# Patient Record
Sex: Female | Born: 1960 | Race: White | Hispanic: No | Marital: Married | Smoking: Never smoker
Health system: Southern US, Community
[De-identification: ages and names within clinical notes are randomized; demographics above are authoritative.]

## PROBLEM LIST (undated history)

## (undated) DIAGNOSIS — E785 Hyperlipidemia, unspecified: Secondary | ICD-10-CM

## (undated) DIAGNOSIS — Z801 Family history of malignant neoplasm of trachea, bronchus and lung: Secondary | ICD-10-CM

## (undated) DIAGNOSIS — F32A Depression, unspecified: Secondary | ICD-10-CM

## (undated) DIAGNOSIS — Z8041 Family history of malignant neoplasm of ovary: Secondary | ICD-10-CM

## (undated) DIAGNOSIS — R87619 Unspecified abnormal cytological findings in specimens from cervix uteri: Secondary | ICD-10-CM

## (undated) DIAGNOSIS — Z803 Family history of malignant neoplasm of breast: Secondary | ICD-10-CM

## (undated) DIAGNOSIS — Z800 Family history of malignant neoplasm of digestive organs: Secondary | ICD-10-CM

## (undated) DIAGNOSIS — F329 Major depressive disorder, single episode, unspecified: Secondary | ICD-10-CM

## (undated) DIAGNOSIS — F1020 Alcohol dependence, uncomplicated: Secondary | ICD-10-CM

## (undated) DIAGNOSIS — F419 Anxiety disorder, unspecified: Secondary | ICD-10-CM

## (undated) HISTORY — DX: Family history of malignant neoplasm of trachea, bronchus and lung: Z80.1

## (undated) HISTORY — DX: Alcohol dependence, uncomplicated: F10.20

## (undated) HISTORY — DX: Hyperlipidemia, unspecified: E78.5

## (undated) HISTORY — DX: Unspecified abnormal cytological findings in specimens from cervix uteri: R87.619

## (undated) HISTORY — DX: Family history of malignant neoplasm of breast: Z80.3

## (undated) HISTORY — DX: Depression, unspecified: F32.A

## (undated) HISTORY — DX: Family history of malignant neoplasm of ovary: Z80.41

## (undated) HISTORY — DX: Anxiety disorder, unspecified: F41.9

## (undated) HISTORY — DX: Family history of malignant neoplasm of digestive organs: Z80.0

---
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
## 2003-07-22 LAB — HM HIV SCREENING LAB: HM HIV Screening: NEGATIVE

## 2009-12-05 HISTORY — PX: NOSE SURGERY: SHX723

## 2013-12-05 HISTORY — PX: OTHER SURGICAL HISTORY: SHX169

## 2015-09-22 LAB — HEMOGLOBIN A1C: Hemoglobin A1C: 5.7

## 2016-09-15 LAB — TSH: TSH: 2.89 (ref <=5.90)

## 2016-09-15 LAB — VITAMIN D 25 HYDROXY (VIT D DEFICIENCY, FRACTURES): Vit D, 25-Hydroxy: 55.9

## 2016-09-15 LAB — HEMOGLOBIN A1C: Hemoglobin A1C: 5.4

## 2017-12-05 HISTORY — PX: SKIN BIOPSY: SHX1

## 2018-02-02 HISTORY — PX: BREAST BIOPSY: SHX20

## 2018-08-17 LAB — HM MAMMOGRAPHY

## 2019-08-23 LAB — CBC AND DIFFERENTIAL
HCT: 41 (ref 36–46)
Hemoglobin: 13.2 (ref 12.0–16.0)
Neutrophils Absolute: 3
Platelets: 375 (ref 150–399)
WBC: 6.4

## 2019-08-23 LAB — HEPATIC FUNCTION PANEL
ALT: 18 (ref 7–35)
AST: 15 (ref 13–35)
Alkaline Phosphatase: 100 (ref 25–125)
Bilirubin, Total: 0.3

## 2019-08-23 LAB — BASIC METABOLIC PANEL
BUN: 15 (ref 4–21)
CO2: 28 — AB (ref 13–22)
Chloride: 103 (ref 99–108)
Creatinine: 0.8 (ref 0.5–1.1)
Glucose: 95
Potassium: 4.1 (ref 3.4–5.3)
Sodium: 146 (ref 137–147)

## 2019-08-23 LAB — LIPID PANEL
Cholesterol: 214 — AB (ref 0–200)
HDL: 89 — AB (ref 35–70)
LDL Cholesterol: 104
LDl/HDL Ratio: 1.2
Triglycerides: 122 (ref 40–160)

## 2019-08-23 LAB — CBC: RBC: 4.25 (ref 3.87–5.11)

## 2019-08-23 LAB — COMPREHENSIVE METABOLIC PANEL
Albumin: 6.2 — AB (ref 3.5–5.0)
Calcium: 10.4 (ref 8.7–10.7)
GFR calc non Af Amer: 77
Globulin: 2.2

## 2019-08-26 LAB — HM MAMMOGRAPHY

## 2019-09-02 LAB — HM PAP SMEAR

## 2019-10-24 ENCOUNTER — Encounter: Payer: Self-pay | Admitting: Family Medicine

## 2019-10-25 LAB — HM COLONOSCOPY

## 2020-03-18 ENCOUNTER — Other Ambulatory Visit: Payer: Self-pay | Admitting: Family Medicine

## 2020-03-18 ENCOUNTER — Ambulatory Visit (INDEPENDENT_AMBULATORY_CARE_PROVIDER_SITE_OTHER): Payer: 59 | Admitting: Family Medicine

## 2020-03-18 ENCOUNTER — Encounter: Payer: Self-pay | Admitting: Family Medicine

## 2020-03-18 ENCOUNTER — Other Ambulatory Visit: Payer: Self-pay

## 2020-03-18 VITALS — BP 136/75 | HR 81 | Temp 97.8°F | Resp 16 | Ht 66.0 in | Wt 133.6 lb

## 2020-03-18 DIAGNOSIS — M159 Polyosteoarthritis, unspecified: Secondary | ICD-10-CM

## 2020-03-18 DIAGNOSIS — M2560 Stiffness of unspecified joint, not elsewhere classified: Secondary | ICD-10-CM | POA: Diagnosis not present

## 2020-03-18 DIAGNOSIS — L853 Xerosis cutis: Secondary | ICD-10-CM | POA: Diagnosis not present

## 2020-03-18 DIAGNOSIS — R293 Abnormal posture: Secondary | ICD-10-CM | POA: Diagnosis not present

## 2020-03-18 DIAGNOSIS — M8949 Other hypertrophic osteoarthropathy, multiple sites: Secondary | ICD-10-CM

## 2020-03-18 DIAGNOSIS — Z0000 Encounter for general adult medical examination without abnormal findings: Secondary | ICD-10-CM

## 2020-03-18 DIAGNOSIS — Z7689 Persons encountering health services in other specified circumstances: Secondary | ICD-10-CM | POA: Diagnosis not present

## 2020-03-18 DIAGNOSIS — Z1159 Encounter for screening for other viral diseases: Secondary | ICD-10-CM

## 2020-03-18 MED ORDER — TRIAMCINOLONE ACETONIDE 0.5 % EX CREA: 1.0000 "application " | TOPICAL_CREAM | Freq: Two times a day (BID) | CUTANEOUS | 1 refills | Status: DC

## 2020-03-18 NOTE — Progress Notes (Addendum)
Subjective:    Patient ID: Gabrielle Silva, female    DOB: 22-Jun-1961, 59 y.o.   MRN: [id number]  Gabrielle Silva is a 59 y.o. female presenting on 03/18/2020 for [place] (dry skin, patchy rough ever since patient moved from [place])  [place] from [place], [place] to [place]. Recently moved back in past month. Her husband has also recently established here at our office  HPI  Poor Posture / Stiff Joints in Spine / History of Arthritis Reports history of known osteoarthritis, however has not had any recent x-rays or other diagnosis. She says mostly self diagnosis and has history of some popping and cracking and stiffness in neck and back. 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 - Now more active and walking  Dry skin on neck Reports new problem since in [place] for past few weeks. Did not have this problem in [place] but she had similar dry skin under arms in past years ago. - Now she is getting more hot / sweaty, using heavy duty mineral sun screen, sweat irritating her skin and can burn at times. She describes patch of dry flaky skin on back of neck, no where else on body, she uses the sun screen other sun exposed areas but it does not bother her - She is gently patting dry not scrubbing. Not used any other topical except a moisturizer of some kind.  Post-menopausal Previously was on OCP, came off in 2014, she was having worsening symptoms of menopausal. She had variety of symptoms. Started on HRT, did well for a while. Now off for past 3 years, doing well. 1 miscarriage / 2 normal pregnancies  History of Gout (Big toes) / Osteoarthritis - multiple joints, thumbs, neck, spine.  History of Depression / Anxiety - Reports that she is currently doing well. Not on medicines. -Background history Age 56-30s, she had issues  with anxiety depression, related to issue of emotional trauma growing up. One primary stressor was her father. - Later she was in problematic relationship and then self treated with alcohol and then was on xanax for period of about 2 years, then on sertraline, she did rehab 2016, came off this and doing well now. - History of suicide age 30. She thought it would save her to run away to college. - No longer on any substances.  Health Maintenance:  Will request outside health records.  Cervical CA Screening: 3-4 rounds of abnormal pap smears. 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 Not always same. Mild abnormal low grade. Has self resolved. Several years ago for abnormal - Last pap smear 08/2019 - normal Updated 03/19/20 received records. Abstracted last pap smear 09/02/19 NILM, negative High Risk HPV.  Colon CA Screening: Last Colonoscopy 10/2019 (done GI in [place]), results with x 2 polyps benign uncertain what type, good for 5 years. Currently asymptomatic. Known family history of colon CA maternal grandfather age >13-70. Due for screening test in approx 5 years or 2025, will request copy of report Updated 03/19/20 received records. Abstracted last colonoscopy 10/25/19, 2 tubular adenoma, negative for high grade, return 5 years.  Breast CA Screening: Last mammogram result 08/2019 in [place], abnormal with microcalifications, stable benign, had prior biopsy L sided (02/2018). Family history Mother initial breast cancer age 40, then recurrence. Currently asymptomatic -  Updated record on 03/19/20 - received record. 08/2018 dx mammogram birads 2 negative, then next screening mammogram 08/2019 birads 2 negative. abstracted   No flowsheet data found.  Past Medical History:  Diagnosis Date  . Abnormal Pap smear of cervix   . Alcoholism ([place])   . Anxiety   . Depression   . Hyperlipidemia    Past Surgical History:  Procedure Laterality Date  . BREAST BIOPSY Left 02/2018    Left side - microcalcifications  . NOSE SURGERY  2011   deviated septum correct, sinus cavity opening  . skin biopsy  2015   negative biopsy  . SKIN BIOPSY  2019   negative biopsy   Social History   Socioeconomic History  . Marital status: Married    Spouse name: Tarynn Contrera  . Number of children: Not on file  . Years of education: College  . Highest education level: Bachelor's degree (e.g., BA, AB, BS)  Occupational History  . Not on file  Tobacco Use  . Smoking status: Never Smoker  . Smokeless tobacco: Never Used  Substance and Sexual Activity  . Alcohol use: Not Currently  . Drug use: Not Currently    Types: Marijuana  . Sexual activity: Not on file  Other Topics Concern  . Not on file  Social History Narrative  . Not on file   Social Determinants of Health   Financial Resource Strain:   . Difficulty of Paying Living Expenses:   Food Insecurity:   . Worried About Charity fundraiser in the Last Year:   . Arboriculturist in the Last Year:   Transportation Needs:   . Film/video editor (Medical):   Marland Kitchen Lack of Transportation (Non-Medical):   Physical Activity:   . Days of Exercise per Week:   . Minutes of Exercise per Session:   Stress:   . Feeling of Stress :   Social Connections:   . Frequency of Communication with Friends and Family:   . Frequency of Social Gatherings with Friends and Family:   . Attends Religious Services:   . Active Member of Clubs or Organizations:   . Attends Archivist Meetings:   Marland Kitchen Marital Status:   Intimate Partner Violence:   . Fear of Current or Ex-Partner:   . Emotionally Abused:   Marland Kitchen Physically Abused:   . Sexually Abused:    Family History  Problem Relation Age of Onset  . Breast cancer Mother 53       recurrence 54, then age 55+ had metastatic bone, lung, brain  . Osteoarthritis Mother   . Osteoporosis Mother   . Anemia Mother   . Hypertension Mother   . Prostate cancer Father 84  . Mood Disorder  Father   . Dementia Father   . Transient ischemic attack Father   . Hernia Brother   . Colon cancer Maternal Grandfather 33       approximate age  . Skin cancer Maternal Grandfather        multiple, nose, face, head - unsure type  . Seizures Daughter   . Migraines Daughter    Current Outpatient Medications on File Prior to Visit  Medication Sig  . ACETAMINOPHEN 8 HOUR PO Take by mouth.  . calcium carbonate (TUMS - DOSED IN MG ELEMENTAL CALCIUM) 500 MG chewable tablet Chew 1 tablet by mouth daily.  . fluticasone (FLONASE) 50 MCG/ACT nasal spray Place into both nostrils daily.  . Glucosamine-Chondroit-Vit C-Mn (GLUCOSAMINE 1500 COMPLEX PO) Take  by mouth.  . Magnesium Gluconate (MAGNESIUM 27 PO) Take by mouth.  . Multiple Vitamin (MULTIVITAMIN) capsule Take 1 capsule by mouth daily.   No current facility-administered medications on file prior to visit.    Review of Systems Per HPI unless specifically indicated above      Objective:    BP 136/75   Pulse 81   Temp 97.8 F (36.6 C) (Temporal)   Resp 16   Ht 5\' 6"  (1.676 m)   Wt 133 lb 9.6 oz (60.6 kg)   BMI 21.56 kg/m   Wt Readings from Last 3 Encounters:  03/18/20 133 lb 9.6 oz (60.6 kg)    Physical Exam Vitals and nursing note reviewed.  Constitutional:      General: She is not in acute distress.    Appearance: She is well-developed. She is not diaphoretic.     Comments: Well-appearing, thin, comfortable, cooperative  HENT:     Head: Normocephalic and atraumatic.  Eyes:     General:        Right eye: No discharge.        Left eye: No discharge.     Conjunctiva/sclera: Conjunctivae normal.  Neck:     Thyroid: No thyromegaly.  Cardiovascular:     Rate and Rhythm: Normal rate and regular rhythm.     Heart sounds: Normal heart sounds. No murmur.  Pulmonary:     Effort: Pulmonary effort is normal. No respiratory distress.     Breath sounds: Normal breath sounds. No wheezing or rales.  Musculoskeletal:         General: Normal range of motion.     Cervical back: Normal range of motion and neck supple.     Comments: Spine - appears normal alignment, forward bending without asymmetry, no focal palpable spinal tenderness or paraspinal muscle hypertrophy or spasm. - Some audible and palpable pop/crack on flex/ext movement of thoracic spine. Full active range of motion flex/ext /rotate  Resting posture has slight thoracic kyphosis  Lymphadenopathy:     Cervical: No cervical adenopathy.  Skin:    General: Skin is warm and dry.     Findings: Rash (dry flaky patch of skin extensive on back of neck wrapping around to sides of neck, no erythema, no focal lesions) present. No erythema.  Neurological:     Mental Status: She is alert and oriented to person, place, and time.  Psychiatric:        Behavior: Behavior normal.     Comments: Well groomed, good eye contact, normal speech and thoughts      Abstracted prior labs from provided record.  Results for orders placed or performed in visit on 03/18/20  HM HIV SCREENING LAB  Result Value Ref Range   HM HIV Screening Negative - Validated   CBC and differential  Result Value Ref Range   Hemoglobin 13.2 12.0 - 16.0   HCT 41 36 - 46   Neutrophils Absolute 3    Platelets 375 150 - 399   WBC 6.4   CBC  Result Value Ref Range   RBC 4.25 3.87 - 5.11  VITAMIN D 25 Hydroxy (Vit-D Deficiency, Fractures)  Result Value Ref Range   Vit D, 25-Hydroxy [id number]   Basic metabolic panel  Result Value Ref Range   Glucose 95    BUN 15 4 - 21   CO2 28 (A) 13 - 22   Creatinine 0.8 0.5 - 1.1   Potassium 4.1 3.4 - 5.3   Sodium 146 137 -  147   Chloride 103 99 - 108  Comprehensive metabolic panel  Result Value Ref Range   Globulin 2.2    GFR calc non Af Amer 77    Calcium 10.4 8.7 - 10.7   Albumin 6.2 (A) 3.5 - 5.0  Lipid panel  Result Value Ref Range   LDl/HDL Ratio 1.2    Triglycerides 122 40 - 160   Cholesterol 214 (A) 0 - 200   HDL 89 (A) 35 - 70   LDL  Cholesterol 104   Hepatic function panel  Result Value Ref Range   Alkaline Phosphatase 100 25 - 125   ALT 18 7 - 35   AST 15 13 - 35   Bilirubin, Total 0.3   Hemoglobin A1c  Result Value Ref Range   Hemoglobin A1C 5.4   TSH  Result Value Ref Range   TSH 2.89 0.41 - 5.90  Hemoglobin A1c  Result Value Ref Range   Hemoglobin A1C 5.7       Assessment & Plan:   Problem List Items Addressed This Visit    Primary osteoarthritis involving multiple joints   Relevant Medications   ACETAMINOPHEN 8 HOUR PO   Joint stiffness of spine   Dry skin dermatitis - Primary   Relevant Medications   triamcinolone cream (KENALOG) 0.5 %    Other Visit Diagnoses    Encounter to establish care with new doctor       Poor posture          Review provided documents from patient on prior lab results and past history / fam history. Updated chart. Rest to be scanned into chart.  Request records from [place], prior PCP including Mammogram, Colonoscopy, Pap Smear records.  #Arthritis / Posture Clinically with some slight thoracic kyphosis, and some stiffness evident in back with exam and with some audible popping of spine on movement. No obvious deformity or scoliosis - Will review records from previous specialist - Anticipate would offer X-rays C / T spine for further evaluation and diagnosis - Recommend routine exercise, stretching, postural exercise regimen, avoid prolong sedentary fixed position especially seated - Future consider physical therapy to work on core strengthening and other muscle groups to assist in proper posture  #Dry skin dermatitis, neck Significant localized patch of dry flaking skin, appears irritated it is high sun exposure, she is utilizing mineral sunscreen, and other products but seems to be causing excessive dryness of skin in this area, other areas not dry. Does not seem to be allergic response. - Recommendations per AVS - Use new rx Triamcinolone topical steroid 0.5% cream  BID 1-2 weeks then stop - Use OTC Aveeno daily moisturizer after steroid, then may apply sensitive skin sunscreen - change type , limit direct sun exposure - Future follow-up consider Dermatology referral if unresolved  Meds ordered this encounter  Medications  . triamcinolone cream (KENALOG) 0.5 %    Sig: Apply 1 application topically 2 (two) times daily. To affected areas, for up to 2 weeks.    Dispense:  30 g    Refill:  1    Follow up plan: Return in about 5 months (around 08/18/2020) for Annual Physical.   Future labs ordered for 08/12/20   Nobie Putnam, [facility] 03/18/2020, 3:42 PM

## 2020-03-18 NOTE — Patient Instructions (Addendum)
Thank you for coming to the office today.  Recommend to sign up for mychart.  Start with some generic postural stretches / range of motion, neck and upper back  Let me know how it goes, what works and doesn't work and I can try to find additional recommendations for you.  If we need to do future X-rays we can consider this to learn more about possible arthritis.  Also can refer to a Physical Therapist for strengthening if need in the future.  Consider different sun screen product - Previously using mineral sunscreen - try switching to different type of sunscreen with different active ingredients, prefer sensitive type, such as Neutrogena / Aveeno / Cetaphil.  --------------------------------------------------  The rash looks most consistent with eczema, this can flare up and get worse due to a variety of factors (excessive dry skin from bathing/showering, soaps, cold weather / indoor heaters, outdoor exposures).  Use the topical steroid creams twice a day for up to 1 week, maximum duration of use per one flare is 10 to 14 days, then STOP using it and allow skin to recover. Caution with over-use may cause lightening of the skin.   Triamcinolone / Kenalog on body only.  Tips to reduce Flares: For baths/showers, limit bathing to every other day if you can (max 1 x daily)  Use a gentle, unscented soap and lukewarm water (hot water is most irritating to skin) Never scrub skin with too much pressure, this causes more irritation. Pat skin dry, then leave it slightly damp. DO NOT scrub it dry. Apply steroid cream to skin and rub in all the way, wait 15 min, then apply a daily moisturizer (Vaseline, Eucerin, Aveeno). Continue daily moisturizer every day of the year (even after flare is resolved) - If you have eczema on hands or dry hands, recommend wearing any type of gloves overnight (cloth, fabric, or even nitrile/latex) to improve effect of topical moisturizer  Last layer is the  sunscreen.  If develops redness, honey colored crust oozing, drainage of pus, bleeding, or redness / swelling, pain, please return for re-evaluation, may have become infected after scratching.  DUE for FASTING BLOOD WORK (no food or drink after midnight before the lab appointment, only water or coffee without cream/sugar on the morning of)  SCHEDULE "Lab Only" visit in the morning at the clinic for lab draw in 5 MONTHS   - Make sure Lab Only appointment is at about 1 week before your next appointment, so that results will be available  For Lab Results, once available within 2-3 days of blood draw, you can can log in to MyChart online to view your results and a brief explanation. Also, we can discuss results at next follow-up visit.   Please schedule a Follow-up Appointment to: Return in about 5 months (around 08/18/2020) for Annual Physical.  If you have any other questions or concerns, please feel free to call the office or send a message through [facility]. You may also schedule an earlier appointment if necessary.  Additionally, you may be receiving a survey about your experience at our office within a few days to 1 week by e-mail or mail. We value your feedback.  Nobie Putnam, DO [place]

## 2020-07-17 DIAGNOSIS — Z1159 Encounter for screening for other viral diseases: Secondary | ICD-10-CM

## 2020-07-17 DIAGNOSIS — Z0000 Encounter for general adult medical examination without abnormal findings: Secondary | ICD-10-CM

## 2020-08-07 LAB — CBC WITH DIFFERENTIAL/PLATELET
Basophils Absolute: 0.1 10*3/uL (ref 0.0–0.2)
Basos: 1 %
EOS (ABSOLUTE): 0.1 10*3/uL (ref 0.0–0.4)
Eos: 2 %
Hematocrit: 40.9 % (ref 34.0–46.6)
Hemoglobin: 13.5 g/dL (ref 11.1–15.9)
Immature Grans (Abs): 0 10*3/uL (ref 0.0–0.1)
Immature Granulocytes: 0 %
Lymphocytes Absolute: 2.8 10*3/uL (ref 0.7–3.1)
Lymphs: 44 %
MCH: 32.1 pg (ref 26.6–33.0)
MCHC: 33 g/dL (ref 31.5–35.7)
MCV: 97 fL (ref 79–97)
Monocytes Absolute: 0.6 10*3/uL (ref 0.1–0.9)
Monocytes: 9 %
Neutrophils Absolute: 2.9 10*3/uL (ref 1.4–7.0)
Neutrophils: 44 %
Platelets: 317 10*3/uL (ref 150–450)
RBC: 4.2 x10E6/uL (ref 3.77–5.28)
RDW: 12.9 % (ref 11.7–15.4)
WBC: 6.5 10*3/uL (ref 3.4–10.8)

## 2020-08-07 LAB — COMPREHENSIVE METABOLIC PANEL
ALT: 26 IU/L (ref 0–32)
AST: 22 IU/L (ref 0–40)
Albumin/Globulin Ratio: 2.1 (ref 1.2–2.2)
Albumin: 5.1 g/dL — ABNORMAL HIGH (ref 3.8–4.9)
Alkaline Phosphatase: 100 IU/L (ref 48–121)
BUN/Creatinine Ratio: 17 (ref 9–23)
BUN: 14 mg/dL (ref 6–24)
Bilirubin Total: 0.4 mg/dL (ref 0.0–1.2)
CO2: 24 mmol/L (ref 20–29)
Calcium: 9.8 mg/dL (ref 8.7–10.2)
Chloride: 102 mmol/L (ref 96–106)
Creatinine, Ser: 0.83 mg/dL (ref 0.57–1.00)
GFR calc Af Amer: 90 mL/min/{1.73_m2} (ref >59)
GFR calc non Af Amer: 78 mL/min/{1.73_m2} (ref >59)
Globulin, Total: 2.4 g/dL (ref 1.5–4.5)
Glucose: 81 mg/dL (ref 65–99)
Potassium: 4.5 mmol/L (ref 3.5–5.2)
Sodium: 140 mmol/L (ref 134–144)
Total Protein: 7.5 g/dL (ref 6.0–8.5)

## 2020-08-07 LAB — LIPID PANEL
Chol/HDL Ratio: 2.4 ratio (ref 0.0–4.4)
Cholesterol, Total: 219 mg/dL — ABNORMAL HIGH (ref 100–199)
HDL: 92 mg/dL (ref >39)
LDL Chol Calc (NIH): 108 mg/dL — ABNORMAL HIGH (ref 0–99)
Triglycerides: 109 mg/dL (ref 0–149)
VLDL Cholesterol Cal: 19 mg/dL (ref 5–40)

## 2020-08-07 LAB — HEMOGLOBIN A1C
Est. average glucose Bld gHb Est-mCnc: 111 mg/dL
Hgb A1c MFr Bld: 5.5 % (ref 4.8–5.6)

## 2020-08-07 LAB — TSH: TSH: 2.84 u[IU]/mL (ref 0.450–4.500)

## 2020-08-07 LAB — HEPATITIS C ANTIBODY: Hep C Virus Ab: <0.1 s/co ratio (ref 0.0–0.9)

## 2020-08-12 ENCOUNTER — Other Ambulatory Visit: Payer: 59

## 2020-08-18 ENCOUNTER — Ambulatory Visit (INDEPENDENT_AMBULATORY_CARE_PROVIDER_SITE_OTHER): Payer: 59 | Admitting: Family Medicine

## 2020-08-18 ENCOUNTER — Encounter: Payer: Self-pay | Admitting: Family Medicine

## 2020-08-18 ENCOUNTER — Other Ambulatory Visit: Payer: Self-pay

## 2020-08-18 VITALS — BP 103/56 | HR 75 | Temp 97.3°F | Resp 16 | Ht 66.0 in | Wt 127.0 lb

## 2020-08-18 DIAGNOSIS — Z23 Encounter for immunization: Secondary | ICD-10-CM

## 2020-08-18 DIAGNOSIS — Z808 Family history of malignant neoplasm of other organs or systems: Secondary | ICD-10-CM

## 2020-08-18 DIAGNOSIS — Z0000 Encounter for general adult medical examination without abnormal findings: Secondary | ICD-10-CM | POA: Diagnosis not present

## 2020-08-18 DIAGNOSIS — Z1283 Encounter for screening for malignant neoplasm of skin: Secondary | ICD-10-CM

## 2020-08-18 DIAGNOSIS — Z1231 Encounter for screening mammogram for malignant neoplasm of breast: Secondary | ICD-10-CM | POA: Diagnosis not present

## 2020-08-18 DIAGNOSIS — E7800 Pure hypercholesterolemia, unspecified: Secondary | ICD-10-CM

## 2020-08-18 NOTE — Patient Instructions (Addendum)
Thank you for coming to the office today.  Tdap shot today, 10 years.  Not concerned on mild elevated albumin protein.  Cholesterol, still very good HDL (good cholesterol) and only minimal elevated LDL bad cholesterol, goal is to keep improving this one with lifestyle.  The 10-year ASCVD risk score Mikey Bussing DC Jr., et al., 2013) is: 1.3%   Values used to calculate the score:     Age: 59 years     Sex: Female     Is Non-Hispanic African American: No     Diabetic: No     Tobacco smoker: No     Systolic Blood Pressure: 277 mmHg     Is BP treated: No     HDL Cholesterol: 92 mg/dL     Total Cholesterol: 219 mg/dL   In future if risk goes up, we can discuss treatment  -----------------------------   For Mammogram screening for breast cancer   Call the [facility] below anytime to schedule your own appointment now that order has been placed.  Order sent to [facility] [address] [place], [place] [address] Phone: [phone]   ------------------- [facility] [place], [place] [address] Phone: [phone]   -------------------------------------------------------------  [facility]  [email or web address]  Check out their site, you can contact them for cost / billing if you are interested  We can see you for a visit to order this and discuss results in future as needed.   Recommend total body surface exam - order is placed.  [place]   [place], [place] [address] Hours: 8AM-5PM Phone: [phone]  Sarina Ser, MD Brendolyn Patty, MD  ----------------------------------  For bladder  Limit caffeine  And bladder irritants  Also recommend timed voiding, set your own schedule to retrain your bladder muscles Can do pelvic floor exercises as well for strengthening And double voiding strategy.  DUE for FASTING BLOOD WORK (no food or drink  after midnight before the lab appointment, only water or coffee without cream/sugar on the morning of)  SCHEDULE "Lab Only" visit in the morning at the clinic for lab draw in 1 YEAR  - Make sure Lab Only appointment is at about 1 week before your next appointment, so that results will be available  For Lab Results, once available within 2-3 days of blood draw, you can can log in to MyChart online to view your results and a brief explanation. Also, we can discuss results at next follow-up visit.    Please schedule a Follow-up Appointment to: Return in about 1 year (around 08/18/2021) for Annual Physical.  If you have any other questions or concerns, please feel free to call the office or send a message through [facility]. You may also schedule an earlier appointment if necessary.  Additionally, you may be receiving a survey about your experience at our office within a few days to 1 week by e-mail or mail. We value your feedback.  Nobie Putnam, DO [place]

## 2020-08-18 NOTE — Progress Notes (Signed)
Subjective:    Patient ID: Gabrielle Silva, female    DOB: 1961-05-18, 59 y.o.   MRN: [id number]  Gabrielle Silva is a 59 y.o. female presenting on 08/18/2020 for Annual Exam   HPI   Here for Annual Physical and Lab Review.  HYPERLIPIDEMIA, elevated LDL - Reports concerns with lipids. Last lipid panel 08/2020, mostly controlled LDL at 108 mild elevated, HDL at 92, excellent result - Not on statin Lifestyle - Diet: balanced healthy diet - Exercise: regular walking, outdoor activity  Post-menopausal Previously was on OCP, came off in 2014, she was having worsening symptoms of menopausal. She had variety of symptoms. Started on HRT, did well for a while. Now off for past 3 years, doing well. 1 miscarriage / 2 normal pregnancies  History of Gout (Big toes) / Osteoarthritis - multiple joints, thumbs, neck, spine.  History of Depression / Anxiety - Reports that she is currently doing well. Not on medicines. -Background history Age 58-30s, she had issues with anxiety depression, related to issue of emotional trauma growing up. One primary stressor was her father. - Later she was in problematic relationship and then self treated with alcohol and then was on xanax for period of about 2 years, then on sertraline, she did rehab 2016, came off this and doing well now. - History of suicide age 53. She thought it would save her to run away to college. - No longer on any substances.  Additional question  Genetic Screening Cancer Interested in testing in future for genetic risk of cancers.  Skin Cancer Screening Interested in Dermatologist evaluation, she has high amount of sun exposure, fam history of skin cancer, unsure if melanoma.  Health Maintenance:  Declines Flu vaccine  TDap today, last 9-10 years ago.  Cervical CA Screening: 3-4 rounds of abnormal pap smears. 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 Not always same. Mild abnormal low grade. Has self resolved.  Several years ago for abnormal - Last pap smear 08/2019 - normal Updated 03/19/20 received records. Abstracted last pap smear 09/02/19 NILM, negative High Risk HPV.  Colon CA Screening: Last Colonoscopy 10/2019 (done GI in [place]), results with x 2 polyps benign uncertain what type, good for 5 years. Currently asymptomatic. Known family history of colon CA maternal grandfather age >70-70. Due for screening test in approx 5 years or 2025, will request copy of report Updated 03/19/20 received records. Abstracted last colonoscopy 10/25/19, 2 tubular adenoma, negative for high grade, return 5 years.  Breast CA Screening: Last mammogram result 08/2019 in [place], abnormal with microcalifications, stable benign, had prior biopsy L sided (02/2018). Family history Mother initial breast cancer age 41, then recurrence. Currently asymptomatic - Updated record on 03/19/20 - received record. 08/2018 dx mammogram birads 2 negative, then next screening mammogram 08/2019 birads 2 negative. Abstracted - She would like to go to [place] for next Mammogram this year, 08/2020, she will call to schedule.     Depression screen [facility] 2/9 08/18/2020  Decreased Interest 0  Down, Depressed, Hopeless 0  PHQ - 2 Score 0   No flowsheet data found.    Past Medical History:  Diagnosis Date  . Abnormal Pap smear of cervix   . Alcoholism ([place])   . Anxiety   . Depression   . Hyperlipidemia    Past Surgical History:  Procedure Laterality Date  . BREAST BIOPSY Left 02/2018   Left side - microcalcifications  . NOSE SURGERY  2011   deviated septum correct,  sinus cavity opening  . skin biopsy  2015   negative biopsy  . SKIN BIOPSY  2019   negative biopsy   Social History   Socioeconomic History  . Marital status: Married    Spouse name: Collin Hendley  . Number of children: Not on file  . Years of education: College  . Highest education level: Bachelor's degree (e.g., BA, AB, BS)  Occupational History   . Not on file  Tobacco Use  . Smoking status: Never Smoker  . Smokeless tobacco: Never Used  Substance and Sexual Activity  . Alcohol use: Not Currently  . Drug use: Not Currently    Types: Marijuana  . Sexual activity: Not on file  Other Topics Concern  . Not on file  Social History Narrative  . Not on file   Social Determinants of Health   Financial Resource Strain:   . Difficulty of Paying Living Expenses: Not on file  Food Insecurity:   . Worried About Charity fundraiser in the Last Year: Not on file  . Ran Out of Food in the Last Year: Not on file  Transportation Needs:   . Lack of Transportation (Medical): Not on file  . Lack of Transportation (Non-Medical): Not on file  Physical Activity:   . Days of Exercise per Week: Not on file  . Minutes of Exercise per Session: Not on file  Stress:   . Feeling of Stress : Not on file  Social Connections:   . Frequency of Communication with Friends and Family: Not on file  . Frequency of Social Gatherings with Friends and Family: Not on file  . Attends Religious Services: Not on file  . Active Member of Clubs or Organizations: Not on file  . Attends Archivist Meetings: Not on file  . Marital Status: Not on file  Intimate Partner Violence:   . Fear of Current or Ex-Partner: Not on file  . Emotionally Abused: Not on file  . Physically Abused: Not on file  . Sexually Abused: Not on file   Family History  Problem Relation Age of Onset  . Breast cancer Mother 69       recurrence 43, then age 35+ had metastatic bone, lung, brain  . Osteoarthritis Mother   . Osteoporosis Mother   . Anemia Mother   . Hypertension Mother   . Heart disease Mother        stent  . Prostate cancer Father 22  . Mood Disorder Father   . Dementia Father   . Transient ischemic attack Father   . Hernia Brother   . Colon cancer Maternal Grandfather 9       approximate age  . Skin cancer Maternal Grandfather        multiple, nose,  face, head - unsure type  . Seizures Daughter   . Migraines Daughter    Current Outpatient Medications on File Prior to Visit  Medication Sig  . ACETAMINOPHEN 8 HOUR PO Take by mouth.  . calcium carbonate (TUMS - DOSED IN MG ELEMENTAL CALCIUM) 500 MG chewable tablet Chew 1 tablet by mouth daily.  . fluticasone (FLONASE) 50 MCG/ACT nasal spray Place into both nostrils daily.  . Glucosamine-Chondroit-Vit C-Mn (GLUCOSAMINE 1500 COMPLEX PO) Take by mouth.  . Magnesium Gluconate (MAGNESIUM 27 PO) Take by mouth.  . Multiple Vitamin (MULTIVITAMIN) capsule Take 1 capsule by mouth daily.  Marland Kitchen triamcinolone cream (KENALOG) 0.5 % Apply 1 application topically 2 (two) times daily. To affected areas, for  up to 2 weeks.   No current facility-administered medications on file prior to visit.    Review of Systems  Constitutional: Negative for activity change, appetite change, chills, diaphoresis, fatigue and fever.  HENT: Negative for congestion and hearing loss.   Eyes: Negative for visual disturbance.  Respiratory: Negative for apnea, cough, chest tightness, shortness of breath and wheezing.   Cardiovascular: Negative for chest pain, palpitations and leg swelling.  Gastrointestinal: Negative for abdominal pain, anal bleeding, blood in stool, constipation, diarrhea, nausea and vomiting.  Endocrine: Negative for cold intolerance.  Genitourinary: Positive for frequency. Negative for difficulty urinating, dysuria and hematuria.  Musculoskeletal: Negative for arthralgias, back pain and neck pain.  Skin: Negative for rash.  Allergic/Immunologic: Negative for environmental allergies.  Neurological: Negative for dizziness, weakness, light-headedness, numbness and headaches.  Hematological: Negative for adenopathy.  Psychiatric/Behavioral: Negative for behavioral problems, dysphoric mood and sleep disturbance. The patient is not nervous/anxious.    Per HPI unless specifically indicated above     Objective:     BP (!) 103/56   Pulse 75   Temp (!) 97.3 F (36.3 C) (Temporal)   Resp 16   Ht 5\' 6"  (1.676 m)   Wt 127 lb (57.6 kg)   SpO2 99%   BMI 20.50 kg/m   Wt Readings from Last 3 Encounters:  08/18/20 127 lb (57.6 kg)  03/18/20 133 lb 9.6 oz (60.6 kg)    Physical Exam Vitals and nursing note reviewed.  Constitutional:      General: She is not in acute distress.    Appearance: She is well-developed. She is not diaphoretic.     Comments: Well-appearing, comfortable, cooperative  HENT:     Head: Normocephalic and atraumatic.     Right Ear: Tympanic membrane, ear canal and external ear normal. There is no impacted cerumen.     Left Ear: Tympanic membrane, ear canal and external ear normal. There is no impacted cerumen.  Eyes:     General:        Right eye: No discharge.        Left eye: No discharge.     Conjunctiva/sclera: Conjunctivae normal.     Pupils: Pupils are equal, round, and reactive to light.  Neck:     Thyroid: No thyromegaly.     Vascular: No carotid bruit.  Cardiovascular:     Rate and Rhythm: Normal rate and regular rhythm.     Heart sounds: Normal heart sounds. No murmur heard.   Pulmonary:     Effort: Pulmonary effort is normal. No respiratory distress.     Breath sounds: Normal breath sounds. No wheezing or rales.  Abdominal:     General: Bowel sounds are normal. There is no distension.     Palpations: Abdomen is soft. There is no mass.     Tenderness: There is no abdominal tenderness.  Musculoskeletal:        General: No tenderness. Normal range of motion.     Cervical back: Normal range of motion and neck supple.     Comments: Upper / Lower Extremities: - Normal muscle tone, strength bilateral upper extremities 5/5, lower extremities 5/5  Lymphadenopathy:     Cervical: No cervical adenopathy.  Skin:    General: Skin is warm and dry.     Findings: No erythema or rash.  Neurological:     Mental Status: She is alert and oriented to person, place, and  time.     Comments: Distal sensation intact to light touch  all extremities  Psychiatric:        Behavior: Behavior normal.     Comments: Well groomed, good eye contact, normal speech and thoughts    Results for orders placed or performed in visit on 07/17/20  Hepatitis C antibody  Result Value Ref Range   Hep C Virus Ab <0.1 0.0 - 0.9 s/co ratio  TSH  Result Value Ref Range   TSH 2.840 0.450 - 4.500 uIU/mL  Comprehensive metabolic panel  Result Value Ref Range   Glucose 81 65 - 99 mg/dL   BUN 14 6 - 24 mg/dL   Creatinine, Ser 0.83 0.57 - 1.00 mg/dL   GFR calc non Af Amer 78 >59 mL/min/1.73   GFR calc Af Amer 90 >59 mL/min/1.73   BUN/Creatinine Ratio 17 9 - 23   Sodium 140 134 - 144 mmol/L   Potassium 4.5 3.5 - 5.2 mmol/L   Chloride 102 96 - 106 mmol/L   CO2 24 20 - 29 mmol/L   Calcium 9.8 8.7 - 10.2 mg/dL   Total Protein 7.5 6.0 - 8.5 g/dL   Albumin 5.1 (H) 3.8 - 4.9 g/dL   Globulin, Total 2.4 1.5 - 4.5 g/dL   Albumin/Globulin Ratio 2.1 1.2 - 2.2   Bilirubin Total 0.4 0.0 - 1.2 mg/dL   Alkaline Phosphatase 100 48 - 121 IU/L   AST 22 0 - 40 IU/L   ALT 26 0 - 32 IU/L  Lipid panel  Result Value Ref Range   Cholesterol, Total 219 (H) 100 - 199 mg/dL   Triglycerides 109 0 - 149 mg/dL   HDL 92 >39 mg/dL   VLDL Cholesterol Cal 19 5 - 40 mg/dL   LDL Chol Calc (NIH) 108 (H) 0 - 99 mg/dL   Chol/HDL Ratio 2.4 0.0 - 4.4 ratio  CBC with Differential/Platelet  Result Value Ref Range   WBC 6.5 3.4 - 10.8 x10E3/uL   RBC 4.20 3.77 - 5.28 x10E6/uL   Hemoglobin 13.5 11.1 - 15.9 g/dL   Hematocrit 40.9 34.0 - 46.6 %   MCV 97 79 - 97 fL   MCH 32.1 26.6 - 33.0 pg   MCHC 33.0 31 - 35 g/dL   RDW 12.9 11.7 - 15.4 %   Platelets 317 150 - 450 x10E3/uL   Neutrophils 44 Not Estab. %   Lymphs 44 Not Estab. %   Monocytes 9 Not Estab. %   Eos 2 Not Estab. %   Basos 1 Not Estab. %   Neutrophils Absolute 2.9 1 - 7 x10E3/uL   Lymphocytes Absolute 2.8 0 - 3 x10E3/uL   Monocytes Absolute 0.6 0  - 0 x10E3/uL   EOS (ABSOLUTE) 0.1 0.0 - 0.4 x10E3/uL   Basophils Absolute 0.1 0 - 0 x10E3/uL   Immature Granulocytes 0 Not Estab. %   Immature Grans (Abs) 0.0 0.0 - 0.1 x10E3/uL  Hemoglobin A1c  Result Value Ref Range   Hgb A1c MFr Bld 5.5 4.8 - 5.6 %   Est. average glucose Bld gHb Est-mCnc 111 mg/dL      Assessment & Plan:   Problem List Items Addressed This Visit    Pure hypercholesterolemia    Mostly Controlled cholesterol on lifestyle Not on statin Last lipid panel 08/2020 The 10-year ASCVD risk score Mikey Bussing DC Jr., et al., 2013) is: 1.3%  Plan: 1. Discussed her lipids in detail - HDL is protective and reassuring. She has very limited ASCVD risk factors, 10 yr score is low, not indicated for statin therapy at  age 84, future reconsider, reviewed calculation 2. Encourage improved lifestyle - low carb/cholesterol, reduce portion size, continue improving regular exercise       Other Visit Diagnoses    Annual physical exam    -  Primary   Need for diphtheria-tetanus-pertussis (Tdap) vaccine       Relevant Orders   Tdap vaccine greater than or equal to 7yo IM (Completed)   Encounter for screening mammogram for malignant neoplasm of breast       Relevant Orders   MM 3D SCREEN BREAST BILATERAL   Family history of skin cancer       Relevant Orders   Ambulatory referral to Dermatology   Skin cancer screening       Relevant Orders   Ambulatory referral to Dermatology      Updated Health Maintenance information - TDap today, good for 10 years - Ordered Mammogram screening at [facility], patient to schedule Reviewed recent lab results with patient Encouraged improvement to lifestyle with diet and exercise Goal maintain healthy weight  #Skin CA Screening Referral to [place], screening total body unsure melanoma, grandfather skin cancer  Orders Placed This Encounter  Procedures  . MM 3D SCREEN BREAST BILATERAL    Standing Status:    Future    Standing Expiration Date:   08/18/2021    Order Specific Question:   Reason for Exam (SYMPTOM  OR DIAGNOSIS REQUIRED)    Answer:   Screening bilateral 3D Mammogram [place]    Order Specific Question:   Preferred imaging location?    Answer:   [facility]  . Tdap vaccine greater than or equal to 7yo IM  . Ambulatory referral to Dermatology    Referral Priority:   Routine    Referral Type:   Consultation    Referral Reason:   Specialty Services Required    Requested Specialty:   Dermatology    Number of Visits Requested:   1     No orders of the defined types were placed in this encounter.     Follow up plan: Return in about 1 year (around 08/18/2021) for Annual Physical.  Future labs 08/2021  Nobie Putnam, [facility] 08/18/2020, 9:33 AM

## 2020-08-19 ENCOUNTER — Other Ambulatory Visit: Payer: Self-pay | Admitting: Family Medicine

## 2020-08-19 DIAGNOSIS — Z0000 Encounter for general adult medical examination without abnormal findings: Secondary | ICD-10-CM

## 2020-08-19 DIAGNOSIS — E7800 Pure hypercholesterolemia, unspecified: Secondary | ICD-10-CM

## 2020-08-19 NOTE — Assessment & Plan Note (Signed)
Mostly Controlled cholesterol on lifestyle Not on statin Last lipid panel 08/2020 The 10-year ASCVD risk score Mikey Bussing DC Jr., et al., 2013) is: 1.3%  Plan: 1. Discussed her lipids in detail - HDL is protective and reassuring. She has very limited ASCVD risk factors, 10 yr score is low, not indicated for statin therapy at age 58, future reconsider, reviewed calculation 2. Encourage improved lifestyle - low carb/cholesterol, reduce portion size, continue improving regular exercise

## 2020-08-27 ENCOUNTER — Other Ambulatory Visit: Payer: Self-pay

## 2020-08-27 ENCOUNTER — Ambulatory Visit
Admission: RE | Admit: 2020-08-27 | Discharge: 2020-08-27 | Disposition: A | Discharge status: Home / Self Care | Payer: 59 | Source: Ambulatory Visit | Attending: Family Medicine | Admitting: Family Medicine

## 2020-08-27 DIAGNOSIS — Z1231 Encounter for screening mammogram for malignant neoplasm of breast: Secondary | ICD-10-CM | POA: Diagnosis not present

## 2020-12-23 ENCOUNTER — Encounter: Payer: Self-pay | Admitting: Dermatology

## 2020-12-23 ENCOUNTER — Ambulatory Visit (INDEPENDENT_AMBULATORY_CARE_PROVIDER_SITE_OTHER): Payer: 59 | Admitting: Dermatology

## 2020-12-23 ENCOUNTER — Other Ambulatory Visit: Payer: Self-pay

## 2020-12-23 DIAGNOSIS — L7211 Pilar cyst: Secondary | ICD-10-CM | POA: Diagnosis not present

## 2020-12-23 DIAGNOSIS — L578 Other skin changes due to chronic exposure to nonionizing radiation: Secondary | ICD-10-CM

## 2020-12-23 DIAGNOSIS — L814 Other melanin hyperpigmentation: Secondary | ICD-10-CM

## 2020-12-23 DIAGNOSIS — L570 Actinic keratosis: Secondary | ICD-10-CM | POA: Diagnosis not present

## 2020-12-23 DIAGNOSIS — Q825 Congenital non-neoplastic nevus: Secondary | ICD-10-CM | POA: Diagnosis not present

## 2020-12-23 DIAGNOSIS — D229 Melanocytic nevi, unspecified: Secondary | ICD-10-CM

## 2020-12-23 DIAGNOSIS — D1800 Hemangioma unspecified site: Secondary | ICD-10-CM

## 2020-12-23 DIAGNOSIS — Z86018 Personal history of other benign neoplasm: Secondary | ICD-10-CM

## 2020-12-23 DIAGNOSIS — D2371 Other benign neoplasm of skin of right lower limb, including hip: Secondary | ICD-10-CM

## 2020-12-23 DIAGNOSIS — Z1283 Encounter for screening for malignant neoplasm of skin: Secondary | ICD-10-CM

## 2020-12-23 DIAGNOSIS — L821 Other seborrheic keratosis: Secondary | ICD-10-CM

## 2020-12-23 NOTE — Progress Notes (Signed)
New Patient Visit  Subjective  Gabrielle Silva is a 60 y.o. female who presents for the following: tbse (New patient here today for tbse. Patient denies personal history of skin cancer but does run in patient's family. Patient states she has history of areas removed in past. ). She notes a spot at her left scalp and a spot on her left cheek.  Patient here for full body skin exam and skin cancer screening.   Objective  Well appearing patient in no apparent distress; mood and affect are within normal limits.  A full examination was performed including scalp, head, eyes, ears, nose, lips, neck, chest, axillae, abdomen, back, buttocks, bilateral upper extremities, bilateral lower extremities, hands, feet, fingers, toes, fingernails, and toenails. All findings within normal limits unless otherwise noted below.  Objective  left frontal scalp: Subcutaneous papule at scalp.   Objective  left superior cheek: Erythematous thin papules with gritty scale.   Objective  Left Plantar Surface of Heel: Violaceous patch  Assessment & Plan  Pilar cyst left frontal scalp  Benign-appearing.  Observation.  Call clinic for new or changing lesions.  Recommend daily use of broad spectrum spf 30+ sunscreen to sun-exposed areas.    Actinic keratosis left superior cheek  Discussed LN2 vs topical therapy with prescription 5-Fluorouracil-calcipotriene cream. Pt prefers topical therapy.   As a result of treatment, redness, scaling, crusting, and open sores may occur during treatment course. One or more than one of these methods may be used and may have to be used several times to control, suppress and eliminate the PreCancerous changes. Discussed treatment course, expected reaction, and possible side effects.   Start 5-fluorouracil/calcipotriene cream twice a day for 4 days to affected areas. Prescription sent to [facility]. Patient advised they will receive an email to purchase  the medication online and have it sent to their home. Patient provided with handout reviewing treatment course and side effects and advised to call or message [place] on MyChart with any concerns.   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 Benign appearing.   Lentigines - Scattered tan macules - Discussed due to sun exposure - Benign, observe - Call for any changes  Seborrheic Keratoses - Stuck-on, waxy, tan-brown papules and plaques  - Discussed benign etiology and prognosis. - Observe - Call for any changes  Melanocytic Nevi - Tan-brown and/or pink-flesh-colored symmetric macules and papules - Benign appearing on exam today - Observation - Call clinic for new or changing moles - Recommend daily use of broad spectrum spf 30+ sunscreen to sun-exposed areas.   Hemangiomas - Red papules - Discussed benign nature - Observe - Call for any changes  Dermatofibroma Right lateral knee  - Firm pink/brown papulenodule with dimple sign - Benign appearing - Call for any changes  Capillary Stain  Left heel  Actinic Damage - Chronic, secondary to cumulative UV/sun exposure - diffuse scaly erythematous macules with underlying dyspigmentation - Recommend daily broad spectrum sunscreen SPF 30+ to sun-exposed areas, reapply every 2 hours as needed.  - Call for new or changing lesions.  History of Dysplastic Nevi Left upper arm  - No evidence of recurrence today - Recommend regular full body skin exams - Recommend daily broad spectrum sunscreen SPF 30+ to sun-exposed areas, reapply every 2 hours as needed.  - Call if any new or changing lesions are noted between office visits - Will request records   Skin cancer screening performed today.  Return in about 3  months (around 03/23/2021), or need record request form filled out, for recheck ak on face .   I, Ruthell Rummage, CMA, am acting as scribe for Forest Gleason, MD.  Documentation: I have reviewed  the above documentation for accuracy and completeness, and I agree with the above.  Forest Gleason, MD

## 2020-12-23 NOTE — Patient Instructions (Addendum)
Melanoma ABCDEs  Melanoma is the most dangerous type of skin cancer, and is the leading cause of death from skin disease.  You are more likely to develop melanoma if you:  Have light-colored skin, light-colored eyes, or red or blond hair  Spend a lot of time in the sun  Tan regularly, either outdoors or in a tanning bed  Have had blistering sunburns, especially during childhood  Have a close family member who has had a melanoma  Have atypical moles or large birthmarks  Early detection of melanoma is key since treatment is typically straightforward and cure rates are extremely high if we catch it early.   The first sign of melanoma is often a change in a mole or a new dark spot.  The ABCDE system is a way of remembering the signs of melanoma.  A for asymmetry:  The two halves do not match. B for border:  The edges of the growth are irregular. C for color:  A mixture of colors are present instead of an even brown color. D for diameter:  Melanomas are usually (but not always) greater than 55mm - the size of a pencil eraser. E for evolution:  The spot keeps changing in size, shape, and color.  Please check your skin once per month between visits. You can use a small mirror in front and a large mirror behind you to keep an eye on the back side or your body.   If you see any new or changing lesions before your next follow-up, please call to schedule a visit.  Please continue daily skin protection including broad spectrum sunscreen SPF 30+ to sun-exposed areas, reapplying every 2 hours as needed when you're outdoors.   Recommend taking Heliocare sun protection supplement daily in sunny weather for additional sun protection. For maximum protection on the sunniest days, you can take up to 2 capsules of regular Heliocare OR take 1 capsule of Heliocare Ultra. For prolonged exposure (such as a full day in the sun), you can repeat your dose of the supplement 4 hours after your first dose. Heliocare  can be purchased at [facility] or at [email or web address].   Instructions for [facility] Medications  One or more of your medications was sent to the [facility] mail order compounding pharmacy. You will receive an email from them and can purchase the medicine through that link. It will then be mailed to your home at the address you confirmed. If for any reason you do not receive an email from them, please check your spam folder. If you still do not find the email, please let us know. [facility] phone number is [phone].  5-Fluorouracil/Calcipotriene Patient Education   Actinic keratoses are the dry, red scaly spots on the skin caused by sun damage. A portion of these spots can turn into skin cancer with time, and treating them can help prevent development of skin cancer.   Treatment of these spots requires removal of the defective skin cells. There are various ways to remove actinic keratoses, including freezing with liquid nitrogen, treatment with creams, or treatment with a blue light procedure in the office.   5-fluorouracil cream is a topical cream used to treat actinic keratoses. It works by interfering with the growth of abnormal fast-growing skin cells, such as actinic keratoses. These cells peel off and are replaced by healthy ones.   5-fluorouracil/calcipotriene is a combination of the 5-fluorouracil cream with a vitamin D analog cream called calcipotriene. The calcipotriene alone does  not treat actinic keratoses. However, when it is combined with 5-fluorouracil, it helps the 5-fluorouracil treat the actinic keratoses much faster so that the same results can be achieved with a much shorter treatment time.  INSTRUCTIONS FOR 5-FLUOROURACIL/CALCIPOTRIENE CREAM:   5-fluorouracil/calcipotriene cream typically only needs to be used for 4-7 days. A thin layer should be applied twice a day to the treatment areas recommended by your physician.   If your physician  prescribed you separate tubes of 5-fluourouracil and calcipotriene, apply a thin layer of 5-fluorouracil followed by a thin layer of calcipotriene.   Avoid contact with your eyes, nostrils, and mouth. Do not use 5-fluorouracil/calcipotriene cream on infected or open wounds.   You will develop redness, irritation and some crusting at areas where you have pre-cancer damage/actinic keratoses. IF YOU DEVELOP PAIN, BLEEDING, OR SIGNIFICANT CRUSTING, STOP THE TREATMENT EARLY - you have already gotten a good response and the actinic keratoses should clear up well.  Wash your hands after applying 5-fluorouracil 5% cream on your skin.   A moisturizer or sunscreen with a minimum SPF 30 should be applied each morning.   Once you have finished the treatment, you can apply a thin layer of Vaseline twice a day to irritated areas to soothe and calm the areas more quickly. If you experience significant discomfort, contact your physician.  For some patients it is necessary to repeat the treatment for best results.  SIDE EFFECTS: When using 5-fluorouracil/calcipotriene cream, you may have mild irritation, such as redness, dryness, swelling, or a mild burning sensation. This usually resolves within 2 weeks. The more actinic keratoses you have, the more redness and inflammation you can expect during treatment. Eye irritation has been reported rarely. If this occurs, please let us know.  If you have any trouble using this cream, please call the office. If you have any other questions about this information, please do not hesitate to ask me before you leave the office.  Apply twice a day for 4 days.

## 2021-01-07 ENCOUNTER — Encounter: Payer: Self-pay | Admitting: Dermatology

## 2021-03-24 ENCOUNTER — Ambulatory Visit (INDEPENDENT_AMBULATORY_CARE_PROVIDER_SITE_OTHER): Payer: 59 | Admitting: Dermatology

## 2021-03-24 ENCOUNTER — Other Ambulatory Visit: Payer: Self-pay

## 2021-03-24 DIAGNOSIS — L821 Other seborrheic keratosis: Secondary | ICD-10-CM | POA: Diagnosis not present

## 2021-03-24 DIAGNOSIS — L578 Other skin changes due to chronic exposure to nonionizing radiation: Secondary | ICD-10-CM

## 2021-03-24 DIAGNOSIS — Z872 Personal history of diseases of the skin and subcutaneous tissue: Secondary | ICD-10-CM | POA: Diagnosis not present

## 2021-03-24 NOTE — Patient Instructions (Addendum)
If you have any questions or concerns for your doctor, please call our main line at [phone] and press option 4 to reach your doctor's medical assistant. If no one answers, please leave a voicemail as directed and we will return your call as soon as possible. Messages left after 4 pm will be answered the following business day.   You may also send [place] a message via [place]. We typically respond to MyChart messages within 1-2 business days.  For prescription refills, please ask your pharmacy to contact our office. Our fax number is [phone].  If you have an urgent issue when the clinic is closed that cannot wait until the next business day, you can page your doctor at the number below.    Please note that while we do our best to be available for urgent issues outside of office hours, we are not available 24/7.   If you have an urgent issue and are unable to reach [place], you may choose to seek medical care at your doctor's office, retail clinic, urgent care center, or emergency room.  If you have a medical emergency, please immediately call 911 or go to the emergency department.  Pager Numbers  - Dr. Nehemiah Massed: [phone]  - Dr. Laurence Ferrari: [phone]  - Dr. Nicole Kindred: [phone]  In the event of inclement weather, please call our main line at [phone] for an update on the status of any delays or closures.  Dermatology Medication Tips: Please keep the boxes that topical medications come in in order to help keep track of the instructions about where and how to use these. Pharmacies typically print the medication instructions only on the boxes and not directly on the medication tubes.   If your medication is too expensive, please contact our office at [phone] option 4 or send [place] a message through [place].   We are unable to tell what your co-pay for medications will be in advance as this is different depending on your insurance coverage. However, we may be able to find a substitute  medication at lower cost or fill out paperwork to get insurance to cover a needed medication.   If a prior authorization is required to get your medication covered by your insurance company, please allow [place] 1-2 business days to complete this process.  Drug prices often vary depending on where the prescription is filled and some pharmacies may offer cheaper prices.  The website www.goodrx.com contains coupons for medications through different pharmacies. The prices here do not account for what the cost may be with help from insurance (it may be cheaper with your insurance), but the website can give you the price if you did not use any insurance.  - You can print the associated coupon and take it with your prescription to the pharmacy.  - You may also stop by our office during regular business hours and pick up a GoodRx coupon card.  - If you need your prescription sent electronically to a different pharmacy, notify our office through [facility] or by phone at [phone] option 4.  Recommend daily broad spectrum sunscreen SPF 30+ to sun-exposed areas, reapply every 2 hours as needed. Call for new or changing lesions.  Staying in the shade or wearing long sleeves, sun glasses (UVA+UVB protection) and wide brim hats (4-inch brim around the entire circumference of the hat) are also recommended for sun protection.      Some Recommended Sunscreens Include:  Body or All Over Sunscreen Blue lizard sensitive Sun bum mineral (  avoid if sensitive to scent) Aveeno Positively Mineral Neutrogena sheer zinc (Slightly harder to rub in) CVS clear zinc (Slightly harder to rub in)  Clear Face Sunscreen EltaMD UV Elements CeraVe hydrating mineral sunscreen 50 face  Tinted Face Sunscreen Alastin Hydratint (good for most skin tones, may be slightly dark if you are very fair) Colorescience Sunforgettable Total Protection Face Shield (good for most skin tones) EltaMD UV Physical La Roche Posay  Mineral Tinted Cotz Flawless Complexion   Powder Sunscreen (Nice for reapplying or applying on the go) Colorescience Sunforgettable Total Protection Brush on Shield (available in different tints)  Face Sunscreen Available in Different Tints Colorescience Sunforgettable Total Protection Brush on Shield  bareMinerals Complexion Rescue Tinted Hydrating Gel Cream Broad Spectrum SPF 30 UnSun mineral tinted (comes in medium/dark and light/medium)  Kids (over 6 months) - Mineral Sunscreens Recommended eltaMD UV Pure MDsolarSciences KidStick 40 SPF Aveeno Baby Continuous Protection Sensitive Zinc Oxide Blue Lizard Kids mineral based sunscreen lotion Mustela Mineral Sunscreen for face and body Neutrogena Sheer Zinc Kids Sunscreen Stick  Tinted to look like a tan PCAskin sheer tint body spray   Recommend taking Heliocare sun protection supplement daily in sunny weather for additional sun protection. For maximum protection on the sunniest days, you can take up to 2 capsules of regular Heliocare OR take 1 capsule of Heliocare Ultra. For prolonged exposure (such as a full day in the sun), you can repeat your dose of the supplement 4 hours after your first dose. Heliocare can be purchased at [facility] or at [email or web address].    [email or web address]

## 2021-03-24 NOTE — Progress Notes (Signed)
   Follow-Up Visit   Subjective  Gabrielle Silva is a 60 y.o. female who presents for the following: Follow-up (Patient here today for AK follow up at left superior cheek. Patient used 5FU/calcipotriene to treat for 7 days total with a 2-3 day break in the middle. ).  The following portions of the chart were reviewed this encounter and updated as appropriate:   Tobacco  Allergies  Meds  Problems  Med Hx  Surg Hx  Fam Hx      Review of Systems:  No other skin or systemic complaints except as noted in HPI or Assessment and Plan.  Objective  Well appearing patient in no apparent distress; mood and affect are within normal limits.  A focused examination was performed including face. Relevant physical exam findings are noted in the Assessment and Plan.   Assessment & Plan    History of PreCancerous Actinic Keratosis  - site(s) of PreCancerous Actinic Keratosis clear today. - these may recur and new lesions may form requiring treatment to prevent transformation into skin cancer - observe for new or changing spots and contact [place] for appointment if occur - photoprotection with sun protective clothing; sunglasses and broad spectrum sunscreen with SPF of at least 30 + and frequent self skin exams recommended - yearly exams by a dermatologist recommended for persons with history of PreCancerous Actinic Keratoses  Seborrheic Keratoses - Stuck-on, waxy, tan-brown papules and/or plaques  - Benign-appearing - Discussed benign etiology and prognosis. - Observe - Call for any changes  Actinic Damage - chronic, secondary to cumulative UV radiation exposure/sun exposure over time - diffuse scaly erythematous macules with underlying dyspigmentation - Recommend daily broad spectrum sunscreen SPF 30+ to sun-exposed areas, reapply every 2 hours as needed.  - Recommend staying in the shade or wearing long sleeves, sun glasses (UVA+UVB protection) and wide brim hats (4-inch brim  around the entire circumference of the hat). - Call for new or changing lesions. - Recommend taking Heliocare sun protection supplement daily in sunny weather for additional sun protection. For maximum protection on the sunniest days, you can take up to 2 capsules of regular Heliocare OR take 1 capsule of Heliocare Ultra. For prolonged exposure (such as a full day in the sun), you can repeat your dose of the supplement 4 hours after your first dose.    Return in about 37 weeks (around 12/08/2021) for TBSE.  Graciella Belton, RMA, am acting as scribe for Forest Gleason, MD .  Documentation: I have reviewed the above documentation for accuracy and completeness, and I agree with the above.  Forest Gleason, MD

## 2021-03-29 ENCOUNTER — Encounter: Payer: Self-pay | Admitting: Dermatology

## 2021-06-13 ENCOUNTER — Emergency Department
Admission: EM | Admit: 2021-06-13 | Discharge: 2021-06-13 | Disposition: A | Discharge status: Home / Self Care | Payer: 59 | Attending: Emergency Medicine | Admitting: Emergency Medicine

## 2021-06-13 ENCOUNTER — Other Ambulatory Visit: Payer: Self-pay

## 2021-06-13 DIAGNOSIS — H43392 Other vitreous opacities, left eye: Secondary | ICD-10-CM | POA: Diagnosis present

## 2021-06-13 MED ORDER — PHENYLEPHRINE HCL 2.5 % OP SOLN
1.0000 [drp] | Freq: Once | OPHTHALMIC | Status: AC
  Administered 2021-06-13: 1 [drp] via OPHTHALMIC
  Filled 2021-06-13: qty 2

## 2021-06-13 MED ORDER — TROPICAMIDE 1 % OP SOLN
1.0000 [drp] | Freq: Once | OPHTHALMIC | Status: AC
  Administered 2021-06-13: 1 [drp] via OPHTHALMIC
  Filled 2021-06-13: qty 15

## 2021-06-13 NOTE — ED Triage Notes (Signed)
Pt states that she thinks she has a retinal tear in her L eye- pt describes having more floaters than usual and an arc of light in her L eye- pt denies pain- pt denies any eye or head injury

## 2021-06-13 NOTE — ED Notes (Signed)
Per Dr. Edison Pace, pt may be discharged. Dr Cinda Quest notified and to bedside.

## 2021-06-13 NOTE — Discharge Instructions (Addendum)
Please return for worsening symptoms.  Please follow-up with your eye doctor as planned.

## 2021-06-13 NOTE — ED Notes (Signed)
Drops requested from pharmacy.

## 2021-06-13 NOTE — ED Provider Notes (Signed)
[facility] Emergency Department Provider Note   ____________________________________________   Event Date/Time   First MD Initiated Contact with Patient 06/13/21 1906     (approximate)  I have reviewed the triage vital signs and the nursing notes.   HISTORY  Chief Complaint Eye Problem  HPI Dorla Guizar is a 60 y.o. female who reports she saw a lot of floaters and an arc of bright light in her left eye.  She has no pain she has no eye trauma.  This started suddenly.  She reports she has severe nearsightedness.  She reports her father had similar symptoms with retinal attachment.  Vision is 20/20 bilaterally.        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 Authorizing Provider  ACETAMINOPHEN 8 HOUR PO Take by mouth.    [provider]  calcium carbonate (TUMS - DOSED IN MG ELEMENTAL CALCIUM) 500 MG chewable tablet Chew 1 tablet by mouth daily.    [provider]  fluticasone (FLONASE) 50 MCG/ACT nasal spray Place into both nostrils daily.    [provider]  Glucosamine-Chondroit-Vit C-Mn (GLUCOSAMINE 1500 COMPLEX PO) Take by mouth.    [provider]  ketotifen (ZADITOR) 0.025 % ophthalmic solution Place 1 drop into both eyes as needed.     [provider]  Magnesium Gluconate (MAGNESIUM 27 PO) Take by mouth.    [provider]  Multiple Vitamin (MULTIVITAMIN) capsule Take 1 capsule by mouth daily.    [provider]    Allergies Patient has no known allergies.  Family History  Problem Relation Age of Onset   Breast cancer Mother 85       recurrence 56, then age 10+ had metastatic bone, lung, brain   Osteoarthritis Mother    Osteoporosis Mother    Anemia Mother    Hypertension Mother    Heart disease Mother        stent   Prostate cancer Father 71   Mood Disorder Father    Dementia Father    Transient ischemic attack Father    Hernia Brother    Colon cancer Maternal Grandfather 79       approximate age   Skin cancer Maternal Grandfather        multiple, nose, face, head - unsure type   Seizures Daughter    Migraines Daughter    Breast cancer Maternal Aunt        mat great aunt    Social History Social History   Tobacco Use   Smoking status: Never   Smokeless tobacco: Never  Substance Use Topics   Alcohol use: Not Currently   Drug use: Not Currently    Types: Marijuana  Constitutional: No fever/chills Eyes: See HPI ENT: No sore throat. Cardiovascular: Denies chest pain. Respiratory: Denies shortness of breath. Gastrointestinal: No abdominal pain.  No nausea, no vomiting.  No diarrhea.  No constipation. Genitourinary: Negative for dysuria. Musculoskeletal: Negative for back pain. Skin: Negative for rash.   ____________________________________________   PHYSICAL EXAM:  VITAL SIGNS: ED Triage Vitals [06/13/21 1852]  Enc Vitals Group     BP (!) 148/79     Pulse Rate 73     Resp 18     Temp 99.1 F (37.3 C)     Temp Source Oral     SpO2 100 %     Weight 117 lb (53.1 kg)     Height 5\' 5"  (1.651 m)     Head Circumference      Peak Flow      Pain Score 0     Pain Loc      Pain Edu?      Excl. in [place]?    Constitutional: Alert and oriented. Well appearing  and in no acute distress. Eyes: Conjunctivae are normal. PERRL. EOMI. difficult to see fundi. Head: Atraumatic. Nose: No congestion/rhinnorhea. Mouth/Throat: Mucous membranes are moist.  Oropharynx non-erythematous. Neck: No stridor.   Cardiovascular: Normal rate, regular rhythm.  Good peripheral circulation. Respiratory: Normal respiratory effort.  No retractions.  Gastrointestinal:  No distention.  Neurologic:  Normal speech and language. No gross focal neurologic deficits are appreciated. No gait instability. Skin:  Skin is warm, dry and intact. No rash noted.   ____________________________________________   LABS (all labs ordered are listed, but only abnormal results are displayed)  Labs Reviewed - No data to display ____________________________________________  EKG   ____________________________________________  RADIOLOGY Gertha Calkin, personally viewed and evaluated these images (plain radiographs) as part of my medical decision making, as well as reviewing the written report by the radiologist.  ED MD interpretati  Official radiology report(s): No results found.  ____________________________________________   PROCEDURES  Procedure(s) performed (including Critical Care):  Procedures   ____________________________________________   INITIAL IMPRESSION / ASSESSMENT AND PLAN / ED COURSE  Visual acuity is 20/20 bilaterally.  Discussed patient with Dr. Edison Pace ophthalmology who asked me to put in 1 drop a of [place] mild and 1 drop of phenylephrine each eye.  He comes in and evaluates patient he thinks this is due to              ____________________________________________   FINAL CLINICAL IMPRESSION(S) / ED DIAGNOSES  Final diagnoses:  Vitreous floaters of left eye     ED Discharge Orders     None        Note:  This document was prepared using Dragon voice recognition software and may include unintentional dictation errors.     Nena Polio, MD 06/13/21 2120

## 2021-06-13 NOTE — ED Notes (Signed)
Pt escorted to eye exam room by ophthalmologist. .

## 2021-06-13 NOTE — Consult Note (Signed)
Reason for Consult: rule out retinal detachment Referring Physician: Dr. Cinda Quest, [facility] ED Chief complaint: new floaters and flashes, left eye.  HPI: Gabrielle Silva is an 60 y.o. 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  She first noticed this at 2 pm today.  When she was moving her eyes back and forth she noticed a C-shaped flash temporally in the left eye.  She denies eye pain, vision loss, blurry vision, curtain like vision loss, headache.  Past Medical History:  Diagnosis Date   Abnormal Pap smear of cervix    Alcoholism ([place])    Anxiety    Depression    Hyperlipidemia     ROS  Past Surgical History:  Procedure Laterality Date   BREAST BIOPSY Left 02/2018   Left side - microcalcifications neg   NOSE SURGERY  2011   deviated septum correct, sinus cavity opening   skin biopsy  2015   negative biopsy   SKIN BIOPSY  2019   negative biopsy    Family History  Problem Relation Age of Onset   Breast cancer Mother 77       recurrence 67, then age 60+ had metastatic bone, lung, brain   Osteoarthritis Mother    Osteoporosis Mother    Anemia Mother    Hypertension Mother    Heart disease Mother        stent   Prostate cancer Father 65   Mood Disorder Father    Dementia Father    Transient ischemic attack Father    Hernia Brother    Colon cancer Maternal Grandfather 16       approximate age   Skin cancer Maternal Grandfather        multiple, nose, face, head - unsure type   Seizures Daughter    Migraines Daughter    Breast cancer Maternal Aunt        mat great aunt    Social History:  reports that she has never smoked. She has never used smokeless tobacco. She reports previous alcohol use. She reports previous drug use. Drug: Marijuana.  Allergies: No Known Allergies  Prior to Admission medications   Medication Sig Start Date End Date Taking? Authorizing Provider  ACETAMINOPHEN 8 HOUR PO Take  by mouth.    [provider]  calcium carbonate (TUMS - DOSED IN MG ELEMENTAL CALCIUM) 500 MG chewable tablet Chew 1 tablet by mouth daily.    [provider]  fluticasone (FLONASE) 50 MCG/ACT nasal spray Place into both nostrils daily.    [provider]  Glucosamine-Chondroit-Vit C-Mn (GLUCOSAMINE 1500 COMPLEX PO) Take by mouth.    [provider]  ketotifen (ZADITOR) 0.025 % ophthalmic solution Place 1 drop into both eyes as needed.    [provider]  Magnesium Gluconate (MAGNESIUM 27 PO) Take by mouth.    [provider]  Multiple Vitamin (MULTIVITAMIN) capsule Take 1 capsule by mouth daily.    [provider]    No results found for this or any previous visit (from the past 48 hour(s)).  No results found.  Blood pressure (!) 148/79, pulse 73, temperature 99.1 F (37.3 C), temperature source Oral, resp. rate 18, height 5\' 5"  (1.651 m), weight 53.1 kg, SpO2 100 %.  Mental status: Alert and Oriented x 4  Visual Acuity:  20/25 OD  20/25 near cc  Pupils:  Equally round/ reactive to light.  No Afferent defect.  Motility:  Full/ orthophoric  Visual Fields:  Full to confrontation  IOP:  soft to palpation.  External/ Lids/ Lashes:  Normal  Anterior Segment:  Conjunctiva:  Normal  OU  Cornea:  Normal  OU  Anterior Chamber: Normal  OU  Lens:   Normal OU  Posterior Segment: Dilated OU with 1% Tropicamide and 2.5% Phenylephrine  Discs:   Normal c/d ratio, no pallor, no edema OU 0.15 OU.  Macula:  Normal  Vessels/ Periphery: Normal.  Small area of paving stone degeneration at 5:00 left eye.  No retinal tears or holes, scleral depression 360 OS>.  PVD OS.    Assessment/Plan: Posterior vitreous degeneration, left eye.  + FH of retinal tear (father).  Today there are no retinal tears, no retinal detachment.  RD precautions/symptoms discussed.  Followup outpatient [facility] 4-6 weeks.    [name] 06/13/2021, 8:33 PM

## 2021-07-14 ENCOUNTER — Other Ambulatory Visit: Payer: Self-pay | Admitting: Family Medicine

## 2021-07-14 DIAGNOSIS — Z1231 Encounter for screening mammogram for malignant neoplasm of breast: Secondary | ICD-10-CM

## 2021-08-10 ENCOUNTER — Other Ambulatory Visit: Payer: Self-pay | Admitting: Family Medicine

## 2021-08-10 DIAGNOSIS — E7800 Pure hypercholesterolemia, unspecified: Secondary | ICD-10-CM

## 2021-08-10 DIAGNOSIS — Z0000 Encounter for general adult medical examination without abnormal findings: Secondary | ICD-10-CM

## 2021-08-11 LAB — COMPREHENSIVE METABOLIC PANEL
ALT: 23 IU/L (ref 0–32)
AST: 20 IU/L (ref 0–40)
Albumin/Globulin Ratio: 2.4 — ABNORMAL HIGH (ref 1.2–2.2)
Albumin: 4.8 g/dL (ref 3.8–4.9)
Alkaline Phosphatase: 97 IU/L (ref 44–121)
BUN/Creatinine Ratio: 16 (ref 9–23)
BUN: 12 mg/dL (ref 6–24)
Bilirubin Total: 0.5 mg/dL (ref 0.0–1.2)
CO2: 22 mmol/L (ref 20–29)
Calcium: 10 mg/dL (ref 8.7–10.2)
Chloride: 100 mmol/L (ref 96–106)
Creatinine, Ser: 0.75 mg/dL (ref 0.57–1.00)
Globulin, Total: 2 g/dL (ref 1.5–4.5)
Glucose: 90 mg/dL (ref 65–99)
Potassium: 4.7 mmol/L (ref 3.5–5.2)
Sodium: 140 mmol/L (ref 134–144)
Total Protein: 6.8 g/dL (ref 6.0–8.5)
eGFR: 92 mL/min/{1.73_m2} (ref >59)

## 2021-08-11 LAB — CBC WITH DIFFERENTIAL/PLATELET
Basophils Absolute: 0.1 10*3/uL (ref 0.0–0.2)
Basos: 1 %
EOS (ABSOLUTE): 0.1 10*3/uL (ref 0.0–0.4)
Eos: 2 %
Hematocrit: 40.3 % (ref 34.0–46.6)
Hemoglobin: 13.5 g/dL (ref 11.1–15.9)
Immature Grans (Abs): 0 10*3/uL (ref 0.0–0.1)
Immature Granulocytes: 0 %
Lymphocytes Absolute: 2.6 10*3/uL (ref 0.7–3.1)
Lymphs: 43 %
MCH: 32.6 pg (ref 26.6–33.0)
MCHC: 33.5 g/dL (ref 31.5–35.7)
MCV: 97 fL (ref 79–97)
Monocytes Absolute: 0.5 10*3/uL (ref 0.1–0.9)
Monocytes: 9 %
Neutrophils Absolute: 2.8 10*3/uL (ref 1.4–7.0)
Neutrophils: 45 %
Platelets: 346 10*3/uL (ref 150–450)
RBC: 4.14 x10E6/uL (ref 3.77–5.28)
RDW: 12 % (ref 11.7–15.4)
WBC: 6.1 10*3/uL (ref 3.4–10.8)

## 2021-08-11 LAB — LIPID PANEL
Chol/HDL Ratio: 2.2 ratio (ref 0.0–4.4)
Cholesterol, Total: 226 mg/dL — ABNORMAL HIGH (ref 100–199)
HDL: 102 mg/dL (ref >39)
LDL Chol Calc (NIH): 108 mg/dL — ABNORMAL HIGH (ref 0–99)
Triglycerides: 96 mg/dL (ref 0–149)
VLDL Cholesterol Cal: 16 mg/dL (ref 5–40)

## 2021-08-11 LAB — HEMOGLOBIN A1C
Est. average glucose Bld gHb Est-mCnc: 111 mg/dL
Hgb A1c MFr Bld: 5.5 % (ref 4.8–5.6)

## 2021-08-11 LAB — TSH: TSH: 2.03 u[IU]/mL (ref 0.450–4.500)

## 2021-08-23 ENCOUNTER — Encounter: Payer: Self-pay | Admitting: Family Medicine

## 2021-08-23 ENCOUNTER — Ambulatory Visit (INDEPENDENT_AMBULATORY_CARE_PROVIDER_SITE_OTHER): Payer: 59 | Admitting: Family Medicine

## 2021-08-23 ENCOUNTER — Other Ambulatory Visit: Payer: Self-pay

## 2021-08-23 VITALS — BP 111/59 | HR 71 | Ht 65.0 in | Wt 122.2 lb

## 2021-08-23 DIAGNOSIS — Z23 Encounter for immunization: Secondary | ICD-10-CM

## 2021-08-23 DIAGNOSIS — Z0000 Encounter for general adult medical examination without abnormal findings: Secondary | ICD-10-CM

## 2021-08-23 NOTE — Progress Notes (Signed)
Subjective:    Patient ID: Gabrielle Silva, female    DOB: [birth year], 60 y.o.   MRN: [id number]  Gabrielle Silva is a 60 y.o. female presenting on 08/23/2021 for Annual Exam   HPI  Here for Annual Physical and Lab Review.  Lifestyle / wellness  Lab review and comparison today  HYPERLIPIDEMIA, elevated LDL - Reports concerns with lipids. Last lipid panel 9/022 showed stable lipids with HDL >100 and LDL 108 overall gradual increase LDL  - Not on statin Lifestyle - Diet: balanced healthy diet - Exercise: regular walking, outdoor activity   Post-menopausal Previously was on OCP, came off in 2014, she was having worsening symptoms of menopausal. She had variety of symptoms. Started on HRT, did well for a while. Now off for past 3 years, doing well. 1 miscarriage / 2 normal pregnancies   History of Gout (Big toes) / Osteoarthritis - multiple joints, thumbs, neck, spine.   History of Depression / Anxiety - Reports that she is currently doing well. Not on medicines. Reviewed past history - No longer on any substances.   Additional question  Updates Due for Shingrix vaccine today  Family history - Adenocarcinoma of the Lung, younger sister age 60, ultimately same cancer that her mother passed from.  Left eye vitreol humor detached, resolved. Not a tear.    Skin Cancer Screening Interested in Dermatologist evaluation, she has high amount of sun exposure, fam history of skin cancer, unsure if melanoma.   Health Maintenance:  COVID - completed 1st series, and booster x 2, and has not had newest Omicron variant vaccine.   Declines Flu vaccine  No pneumonia vaccine indicated at this time.   Tdap updated 08/18/20   Cervical CA Screening: 3-4 rounds of abnormal pap smears. 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 Not always same. Mild abnormal low grade. Has self resolved. Several years ago for abnormal Updated 03/19/20 received records. Abstracted last pap  smear 09/02/19 NILM, negative High Risk HPV.   Colon CA Screening: Last Colonoscopy 10/2019 (done GI in [place]), results with x 2 polyps benign uncertain what type, good for 5 years. Currently asymptomatic. Known family history of colon CA maternal grandfather age >40-70. Due for screening test in approx 5 years or 2025 Updated 03/19/20 received records. Abstracted last colonoscopy 10/25/19, 2 tubular adenoma, negative for high grade, return 5 years.   Breast CA Screening: Last mammogram result 08/2020 Previous results in [place], abnormal with microcalifications, stable benign, had prior biopsy L sided (02/2018). Family history Mother initial breast cancer age 59, then recurrence. Currently asymptomatic - Updated record on 03/19/20 - received record. 08/2018 dx mammogram birads 2 negative, then screening mammogram 08/2019 birads 2 negative. Abstracted     Depression screen [facility] 2/9 08/23/2021 08/18/2020  Decreased Interest 0 0  Down, Depressed, Hopeless 0 0  PHQ - 2 Score 0 0  Altered sleeping 0 -  Tired, decreased energy 0 -  Change in appetite 0 -  Feeling bad or failure about yourself  0 -  Trouble concentrating 1 -  Moving slowly or fidgety/restless 0 -  Suicidal thoughts 0 -  PHQ-9 Score 1 -  Difficult doing work/chores Not difficult at all -    Past Medical History:  Diagnosis Date   Abnormal Pap smear of cervix    Alcoholism ([place])    Anxiety    Depression    Hyperlipidemia    Past Surgical History:  Procedure Laterality Date   BREAST BIOPSY Left  02/2018   Left side - microcalcifications neg   NOSE SURGERY  2011   deviated septum correct, sinus cavity opening   skin biopsy  2015   negative biopsy   SKIN BIOPSY  2019   negative biopsy   Social History   Socioeconomic History   Marital status: Married    Spouse name: Marieclaire Bettenhausen   Number of children: Not on file   Years of education: College   Highest education level: Bachelor's degree (e.g., BA, AB, BS)   Occupational History   Not on file  Tobacco Use   Smoking status: Never   Smokeless tobacco: Never  Substance and Sexual Activity   Alcohol use: Not Currently   Drug use: Not Currently    Types: Marijuana   Sexual activity: Not on file  Other Topics Concern   Not on file  Social History Narrative   Not on file   Social Determinants of Health   Financial Resource Strain: Not on file  Food Insecurity: Not on file  Transportation Needs: Not on file  Physical Activity: Not on file  Stress: Not on file  Social Connections: Not on file  Intimate Partner Violence: Not on file   Family History  Problem Relation Age of Onset   Breast cancer Mother 64       recurrence 23, then age 61+ had metastatic bone, lung, brain   Osteoarthritis Mother    Osteoporosis Mother    Anemia Mother    Hypertension Mother    Heart disease Mother        stent   Prostate cancer Father 76   Mood Disorder Father    Dementia Father    Transient ischemic attack Father    Lung cancer Sister    Hernia Brother    Colon cancer Maternal Grandfather 79       approximate age   Skin cancer Maternal Grandfather        multiple, nose, face, head - unsure type   Seizures Daughter    Migraines Daughter    Breast cancer Maternal Aunt        mat great aunt   Current Outpatient Medications on File Prior to Visit  Medication Sig   ACETAMINOPHEN 8 HOUR PO Take by mouth.   calcium carbonate (TUMS - DOSED IN MG ELEMENTAL CALCIUM) 500 MG chewable tablet Chew 1 tablet by mouth daily.   fluticasone (FLONASE) 50 MCG/ACT nasal spray Place into both nostrils daily.   Glucosamine-Chondroit-Vit C-Mn (GLUCOSAMINE 1500 COMPLEX PO) Take by mouth.   ketotifen (ZADITOR) 0.025 % ophthalmic solution Place 1 drop into both eyes as needed.   Magnesium Gluconate (MAGNESIUM 27 PO) Take by mouth.   Multiple Vitamin (MULTIVITAMIN) capsule Take 1 capsule by mouth daily.   No current facility-administered medications on file prior  to visit.    Review of Systems  Constitutional:  Negative for activity change, appetite change, chills, diaphoresis, fatigue and fever.  HENT:  Negative for congestion and hearing loss.   Eyes:  Negative for visual disturbance.  Respiratory:  Negative for cough, chest tightness, shortness of breath and wheezing.   Cardiovascular:  Negative for chest pain, palpitations and leg swelling.  Gastrointestinal:  Negative for abdominal pain, constipation, diarrhea, nausea and vomiting.  Genitourinary:  Negative for dysuria, frequency and hematuria.  Musculoskeletal:  Negative for arthralgias and neck pain.  Skin:  Negative for rash.  Neurological:  Negative for dizziness, weakness, light-headedness, numbness and headaches.  Hematological:  Negative for adenopathy.  Psychiatric/Behavioral:  Negative for behavioral problems, dysphoric mood and sleep disturbance.   Per HPI unless specifically indicated above     Objective:    BP (!) 111/59   Pulse 71   Ht 5' 5"  (1.651 m)   Wt 122 lb 3.2 oz (55.4 kg)   SpO2 100%   BMI 20.34 kg/m   Wt Readings from Last 3 Encounters:  08/23/21 122 lb 3.2 oz (55.4 kg)  06/13/21 117 lb (53.1 kg)  08/18/20 127 lb (57.6 kg)    Physical Exam Vitals and nursing note reviewed.  Constitutional:      General: She is not in acute distress.    Appearance: She is well-developed. She is not diaphoretic.     Comments: Well-appearing, comfortable, cooperative  HENT:     Head: Normocephalic and atraumatic.  Eyes:     General:        Right eye: No discharge.        Left eye: No discharge.     Conjunctiva/sclera: Conjunctivae normal.     Pupils: Pupils are equal, round, and reactive to light.  Neck:     Thyroid: No thyromegaly.  Cardiovascular:     Rate and Rhythm: Normal rate and regular rhythm.     Pulses: Normal pulses.     Heart sounds: Normal heart sounds. No murmur heard. Pulmonary:     Effort: Pulmonary effort is normal. No respiratory distress.      Breath sounds: Normal breath sounds. No wheezing or rales.  Abdominal:     General: Bowel sounds are normal. There is no distension.     Palpations: Abdomen is soft. There is no mass.     Tenderness: There is no abdominal tenderness.  Musculoskeletal:        General: No tenderness. Normal range of motion.     Cervical back: Normal range of motion and neck supple.     Comments: Upper / Lower Extremities: - Normal muscle tone, strength bilateral upper extremities 5/5, lower extremities 5/5  Lymphadenopathy:     Cervical: No cervical adenopathy.  Skin:    General: Skin is warm and dry.     Findings: No erythema or rash.  Neurological:     Mental Status: She is alert and oriented to person, place, and time.     Comments: Distal sensation intact to light touch all extremities  Psychiatric:        Mood and Affect: Mood normal.        Behavior: Behavior normal.        Thought Content: Thought content normal.     Comments: Well groomed, good eye contact, normal speech and thoughts     Results for orders placed or performed in visit on 08/10/21  CBC with Differential/Platelet  Result Value Ref Range   WBC 6.1 3.4 - 10.8 x10E3/uL   RBC 4.14 3.77 - 5.28 x10E6/uL   Hemoglobin 13.5 11.1 - 15.9 g/dL   Hematocrit 40.3 34.0 - 46.6 %   MCV 97 79 - 97 fL   MCH 32.6 26.6 - 33.0 pg   MCHC 33.5 31.5 - 35.7 g/dL   RDW 12.0 11.7 - 15.4 %   Platelets 346 150 - 450 x10E3/uL   Neutrophils 45 Not Estab. %   Lymphs 43 Not Estab. %   Monocytes 9 Not Estab. %   Eos 2 Not Estab. %   Basos 1 Not Estab. %   Neutrophils Absolute 2.8 1.4 - 7.0 x10E3/uL   Lymphocytes Absolute 2.6  0.7 - 3.1 x10E3/uL   Monocytes Absolute 0.5 0.1 - 0.9 x10E3/uL   EOS (ABSOLUTE) 0.1 0.0 - 0.4 x10E3/uL   Basophils Absolute 0.1 0.0 - 0.2 x10E3/uL   Immature Granulocytes 0 Not Estab. %   Immature Grans (Abs) 0.0 0.0 - 0.1 x10E3/uL  Lipid panel  Result Value Ref Range   Cholesterol, Total 226 (H) 100 - 199 mg/dL    Triglycerides 96 0 - 149 mg/dL   HDL 102 >39 mg/dL   VLDL Cholesterol Cal 16 5 - 40 mg/dL   LDL Chol Calc (NIH) 108 (H) 0 - 99 mg/dL   Chol/HDL Ratio 2.2 0.0 - 4.4 ratio  Hemoglobin A1c  Result Value Ref Range   Hgb A1c MFr Bld 5.5 4.8 - 5.6 %   Est. average glucose Bld gHb Est-mCnc 111 mg/dL  TSH  Result Value Ref Range   TSH 2.030 0.450 - 4.500 uIU/mL  Comprehensive metabolic panel  Result Value Ref Range   Glucose 90 65 - 99 mg/dL   BUN 12 6 - 24 mg/dL   Creatinine, Ser 0.75 0.57 - 1.00 mg/dL   eGFR 92 >59 mL/min/1.73   BUN/Creatinine Ratio 16 9 - 23   Sodium 140 134 - 144 mmol/L   Potassium 4.7 3.5 - 5.2 mmol/L   Chloride 100 96 - 106 mmol/L   CO2 22 20 - 29 mmol/L   Calcium 10.0 8.7 - 10.2 mg/dL   Total Protein 6.8 6.0 - 8.5 g/dL   Albumin 4.8 3.8 - 4.9 g/dL   Globulin, Total 2.0 1.5 - 4.5 g/dL   Albumin/Globulin Ratio 2.4 (H) 1.2 - 2.2   Bilirubin Total 0.5 0.0 - 1.2 mg/dL   Alkaline Phosphatase 97 44 - 121 IU/L   AST 20 0 - 40 IU/L   ALT 23 0 - 32 IU/L      Assessment & Plan:   Problem List Items Addressed This Visit   None Visit Diagnoses     Annual physical exam    -  Primary   Need for shingles vaccine       Relevant Orders   Varicella-zoster vaccine IM       Updated Health Maintenance information Shingrix vaccine dose #1 today OUT of stock - will return for 1st dose then  repeat 2nd dose 2-6 months Reviewed recent lab results with patient Encouraged improvement to lifestyle with diet and exercise Goal of weight loss  New fam history dx adenocarcinoma of lung awaiting updates for genetic risk or other risk factors, may pursue further testing based on these updates or can consider Chest X-ray preliminary testing in future   No orders of the defined types were placed in this encounter.   Follow up plan: Return in about 1 year (around 08/23/2022) for 1 year fasting lab only then 1 week later Annual Physical.   Nobie Putnam, DO [facility] 08/23/2021, 9:39 AM

## 2021-08-23 NOTE — Patient Instructions (Addendum)
Thank you for coming to the office today.  Due for Shingrix shingles vaccine when ready, we can contact you when its back in stock, or feel free to check back message or call within 1 week.  Next dose in 2-6 months after you receive 1st shingrix.  Then when ready, can do new COVID Omicron Bivalent Updated Moderna/Pfizer vaccine. Pharmacy schedule.  Cardiovascular risk is very low. Great job. Keep up the good work with current low cholesterol diet.  DUE for FASTING BLOOD WORK (no food or drink after midnight before the lab appointment, only water or coffee without cream/sugar on the morning of)  SCHEDULE "Lab Only" visit in the morning at the clinic for lab draw in 1 YEAR  - Make sure Lab Only appointment is at about 1 week before your next appointment, so that results will be available  For Lab Results, once available within 2-3 days of blood draw, you can can log in to MyChart online to view your results and a brief explanation. Also, we can discuss results at next follow-up visit.    Please schedule a Follow-up Appointment to: Return in about 1 year (around 08/23/2022) for 1 year fasting lab only then 1 week later Annual Physical.  If you have any other questions or concerns, please feel free to call the office or send a message through [place]. You may also schedule an earlier appointment if necessary.  Additionally, you may be receiving a survey about your experience at our office within a few days to 1 week by e-mail or mail. We value your feedback.  Nobie Putnam, DO [place]

## 2021-08-31 ENCOUNTER — Other Ambulatory Visit: Payer: Self-pay

## 2021-08-31 ENCOUNTER — Encounter: Payer: Self-pay | Admitting: Dermatology

## 2021-08-31 ENCOUNTER — Ambulatory Visit
Admission: RE | Admit: 2021-08-31 | Discharge: 2021-08-31 | Disposition: A | Discharge status: Home / Self Care | Payer: 59 | Source: Ambulatory Visit | Attending: Family Medicine | Admitting: Family Medicine

## 2021-08-31 DIAGNOSIS — Z1231 Encounter for screening mammogram for malignant neoplasm of breast: Secondary | ICD-10-CM | POA: Insufficient documentation

## 2021-09-02 DIAGNOSIS — Z808 Family history of malignant neoplasm of other organs or systems: Secondary | ICD-10-CM

## 2021-09-02 HISTORY — DX: Family history of malignant neoplasm of other organs or systems: Z80.8

## 2021-09-05 ENCOUNTER — Encounter: Payer: Self-pay | Admitting: Family Medicine

## 2021-09-05 DIAGNOSIS — Z8489 Family history of other specified conditions: Secondary | ICD-10-CM

## 2021-09-05 DIAGNOSIS — Z809 Family history of malignant neoplasm, unspecified: Secondary | ICD-10-CM

## 2021-09-10 NOTE — Addendum Note (Signed)
Addended by: Olin Hauser on: 09/10/2021 08:14 AM   Modules accepted: Orders

## 2021-09-30 ENCOUNTER — Encounter: Payer: Self-pay | Admitting: Licensed Clinical Social Worker

## 2021-10-05 ENCOUNTER — Encounter: Payer: Self-pay | Admitting: Licensed Clinical Social Worker

## 2021-10-05 ENCOUNTER — Inpatient Hospital Stay: Payer: 59 | Attending: Oncology | Admitting: Licensed Clinical Social Worker

## 2021-10-05 ENCOUNTER — Other Ambulatory Visit: Payer: Self-pay

## 2021-10-05 ENCOUNTER — Inpatient Hospital Stay: Payer: 59

## 2021-10-05 DIAGNOSIS — Z808 Family history of malignant neoplasm of other organs or systems: Secondary | ICD-10-CM

## 2021-10-05 DIAGNOSIS — Z800 Family history of malignant neoplasm of digestive organs: Secondary | ICD-10-CM | POA: Diagnosis not present

## 2021-10-05 DIAGNOSIS — Z801 Family history of malignant neoplasm of trachea, bronchus and lung: Secondary | ICD-10-CM | POA: Insufficient documentation

## 2021-10-05 DIAGNOSIS — Z803 Family history of malignant neoplasm of breast: Secondary | ICD-10-CM

## 2021-10-05 DIAGNOSIS — Z8041 Family history of malignant neoplasm of ovary: Secondary | ICD-10-CM

## 2021-10-05 NOTE — Progress Notes (Signed)
REFERRING PROVIDER: Olin Hauser, DO [address] [place],  [place] [address]  PRIMARY PROVIDER:  Olin Hauser, DO  PRIMARY REASON FOR VISIT:  1. Family history of breast cancer   2. Family history of melanoma   3. Family history of lung cancer   4. Family history of colon cancer   5. Family history of pancreatic cancer   6. Family history of ovarian cancer      HISTORY OF PRESENT ILLNESS:   Gabrielle Silva, a 60 y.o. female, was seen for a [place] cancer genetics consultation at the request of Dr. Parks Ranger due to a family history of cancer.  [name] presents to clinic today to discuss the possibility of a hereditary predisposition to cancer, genetic testing, and to further clarify her future cancer risks, as well as potential cancer risks for family members.   [name] is a 60 y.o. female with no personal history of cancer.    CANCER HISTORY:  Oncology History   No history exists.     RISK FACTORS:  Menarche was at age 57-14.  First live birth at age 46.  OCP use for approximately  30  years.  Ovaries intact: yes.  Hysterectomy: no.  HRT use: 2 years. Colonoscopy: yes;  2 polyps removed . Mammogram within the last year: yes. Number of breast biopsies: 1. Up to date with pelvic exams: yes.  Past Medical History:  Diagnosis Date   Abnormal Pap smear of cervix    Alcoholism ([place])    Anxiety    Depression    Family history of breast cancer    Family history of colon cancer    Family history of lung cancer    Family history of melanoma 09/02/2021   patient's brother, 43, was recently diagnosed with a 3.29m depth melanoma on the back of his scalp, stage 2B or 3   Family history of melanoma    Family history of ovarian cancer    Family history of pancreatic cancer    Hyperlipidemia     Past Surgical History:  Procedure Laterality Date   BREAST BIOPSY Left 02/2018   Left side - microcalcifications neg   NOSE SURGERY  2011   deviated  septum correct, sinus cavity opening   skin biopsy  2015   negative biopsy   SKIN BIOPSY  2019   negative biopsy    Social History   Socioeconomic History   Marital status: Married    Spouse name: NJamea Robicheaux  Number of children: Not on file   Years of education: College   Highest education level: Bachelor's degree (e.g., BA, AB, BS)  Occupational History   Not on file  Tobacco Use   Smoking status: Never   Smokeless tobacco: Never  Substance and Sexual Activity   Alcohol use: Not Currently   Drug use: Not Currently    Types: Marijuana   Sexual activity: Not on file  Other Topics Concern   Not on file  Social History Narrative   Not on file   Social Determinants of Health   Financial Resource Strain: Not on file  Food Insecurity: Not on file  Transportation Needs: Not on file  Physical Activity: Not on file  Stress: Not on file  Social Connections: Not on file     FAMILY HISTORY:  We obtained a detailed, 4-generation family history.  Significant diagnoses are listed below: Family History  Problem Relation Age of Onset   Breast cancer Mother 677  recurrence 71, then age 74+ had metastatic bone, lung, brain   Osteoarthritis Mother    Osteoporosis Mother    Anemia Mother    Hypertension Mother    Heart disease Mother        stent   Prostate cancer Father        dx 54s   Mood Disorder Father    Dementia Father    Transient ischemic attack Father    Lung cancer Sister    Hernia Brother    Melanoma Brother    Colon cancer Maternal Grandfather 64       approximate age   Skin cancer Maternal Grandfather        multiple, nose, face, head - unsure type   Seizures Daughter    Migraines Daughter    Ovarian cancer Other 51   Breast cancer Other    Ms. Drone has 2 daughters (51 and 60) and 4 step children. She has 1 brother, 78, recently diagnosed with melanoma. She has 1 sister, 56, recently diagnosed with non small cell lung cancer. She had tumor  testing that showed a TP53 mutation, but negative/normal germline genetic testing. Reports available and reviewed during session.  Ms. Arscott mother had breast cancer at 48, again at 90, and then lung cancer at 49 that was metastatic and she passed at 65. Patient had 1 maternal uncle and he had pancreatic cancer and died in his 67s. Maternal grandmother died at 79. Grandfather had colon cancer in his 36s and died in his 65s, he also had skin cancer. He had 2 sisters that also had cancer, 1 sister had ovarian and 1 sister had breast cancer.   Ms. Pointer father had prostate cancer at 63 and died at 3. Patient had 1 paternal aunt, no known information about her. Paternal grandmother died at 55 of dementia, grandfather died of heart issues at 38.   Ms. Silvestro is aware of previous family history of genetic testing for hereditary cancer risks. Patient's maternal ancestors are of English/Scottish/Welsh descent, and paternal ancestors are of English/Scottish/Welsh descent. There is no reported Ashkenazi Jewish ancestry. There is no known consanguinity.    GENETIC COUNSELING ASSESSMENT: Ms. Pae is a 60 y.o. female with a family history of cancer which is somewhat suggestive of a hereditary cancer syndrome and predisposition to cancer. We, therefore, discussed and recommended the following at today's visit.   DISCUSSION: We discussed that approximately 10% of breast cancer is hereditary. Most cases of hereditary breast cancer are associated with BRCA1/BRCA2 genes, although there are other genes associated with hereditary  cancer as well including ones associated with other cancers in her family such as pancreatic, colon and ovarian. Cancers and risks are gene specific.  We discussed that testing is beneficial for several reasons including knowing about cancer risks, identifying potential screening and risk-reduction options that may be appropriate, and to understand if other family members could be  at risk for cancer and allow them to undergo genetic testing.   We reviewed the characteristics, features and inheritance patterns of hereditary cancer syndromes. We also discussed genetic testing, including the appropriate family members to test, the process of testing, insurance coverage and turn-around-time for results. We discussed the implications of a negative, positive and/or variant of uncertain significant result. We recommended Ms. Delcid pursue genetic testing for the [facility] Multi-Cancer Panel+RNA  gene panel.   The Multi-Cancer Panel + RNA offered by [facility] includes sequencing and/or deletion duplication testing of the following 84 genes: AIP, ALK,  APC, ATM, AXIN2,BAP1,  BARD1, BLM, BMPR1A, BRCA1, BRCA2, BRIP1, CASR, CDC73, CDH1, CDK4, CDKN1B, CDKN1C, CDKN2A (p14ARF), CDKN2A (p16INK4a), CEBPA, CHEK2, CTNNA1, DICER1, DIS3L2, EGFR (c.2369C>T, p.Thr790Met variant only), EPCAM (Deletion/duplication testing only), FH, FLCN, GATA2, GPC3, GREM1 (Promoter region deletion/duplication testing only), HOXB13 (c.251G>A, p.Gly84Glu), HRAS, KIT, MAX, MEN1, MET, MITF (c.952G>A, p.Glu318Lys variant only), MLH1, MSH2, MSH3, MSH6, MUTYH, NBN, NF1, NF2, NTHL1, PALB2, PDGFRA, PHOX2B, PMS2, POLD1, POLE, POT1, PRKAR1A, PTCH1, PTEN, RAD50, RAD51C, RAD51D, RB1, RECQL4, RET, RUNX1, SDHAF2, SDHA (sequence changes only), SDHB, SDHC, SDHD, SMAD4, SMARCA4, SMARCB1, SMARCE1, STK11, SUFU, TERC, TERT, TMEM127, TP53, TSC1, TSC2, VHL, WRN and WT1.   Based on Ms. Axford's family history of cancer, she meets medical criteria for genetic testing. Despite that she meets criteria, she may still have an out of pocket cost. We discussed that if her out of pocket cost for testing is over $100, the laboratory will call and confirm whether she wants to proceed with testing.  If the out of pocket cost of testing is less than $100 she will be billed by the genetic testing laboratory.   We discussed that some people do not want to  undergo genetic testing due to fear of genetic discrimination.  A federal law called the Genetic Information Non-Discrimination Act (GINA) of 2008 helps protect individuals against genetic discrimination based on their genetic test results.  It impacts both health insurance and employment.  For health insurance, it protects against increased premiums, being kicked off insurance or being forced to take a test in order to be insured.  For employment it protects against hiring, firing and promoting decisions based on genetic test results.  Health status due to a cancer diagnosis is not protected under GINA.  This law does not protect life insurance, disability insurance, or other types of insurance.   PLAN: After considering the risks, benefits, and limitations, Ms. Erker provided informed consent to pursue genetic testing and the blood sample was sent to [facility] for analysis of the Multi-Cancer+RNA panel. Results should be available within approximately 2-3 weeks' time, at which point they will be disclosed by telephone to Ms. Madonia, as will any additional recommendations warranted by these results. Ms. Guin will receive a summary of her genetic counseling visit and a copy of her results once available. This information will also be available in Epic.   Ms. Archbold questions were answered to her satisfaction today. Our contact information was provided should additional questions or concerns arise. Thank you for the referral and allowing [place] to share in the care of your patient.   Faith Rogue, MS, [facility] Genetic Counselor [email or web address] Phone: [phone]  The patient was seen for a total of 30 minutes in face-to-face genetic counseling.  Patient was seen with her husband Adah Salvage. Dr. Grayland Ormond was available for discussion regarding this case.   _______________________________________________________________________ For Office Staff:  Number of people involved in  session: 2 Was an Intern/ student involved with case: no

## 2021-11-08 ENCOUNTER — Telehealth: Payer: Self-pay | Admitting: Licensed Clinical Social Worker

## 2021-11-09 ENCOUNTER — Encounter: Payer: Self-pay | Admitting: Licensed Clinical Social Worker

## 2021-11-09 ENCOUNTER — Ambulatory Visit: Payer: Self-pay | Admitting: Licensed Clinical Social Worker

## 2021-11-09 DIAGNOSIS — Z8041 Family history of malignant neoplasm of ovary: Secondary | ICD-10-CM

## 2021-11-09 DIAGNOSIS — Z1379 Encounter for other screening for genetic and chromosomal anomalies: Secondary | ICD-10-CM | POA: Insufficient documentation

## 2021-11-09 DIAGNOSIS — Z800 Family history of malignant neoplasm of digestive organs: Secondary | ICD-10-CM

## 2021-11-09 DIAGNOSIS — Z801 Family history of malignant neoplasm of trachea, bronchus and lung: Secondary | ICD-10-CM

## 2021-11-09 DIAGNOSIS — Z803 Family history of malignant neoplasm of breast: Secondary | ICD-10-CM

## 2021-11-09 DIAGNOSIS — Z808 Family history of malignant neoplasm of other organs or systems: Secondary | ICD-10-CM

## 2021-11-09 NOTE — Progress Notes (Signed)
HPI:  Gabrielle Silva was previously seen in the [facility] due to a family history of cancer and concerns regarding a hereditary predisposition to cancer. Please refer to our prior cancer genetics clinic note for more information regarding our discussion, assessment and recommendations, at the time. Gabrielle Silva recent genetic test results were disclosed to her, as were recommendations warranted by these results. These results and recommendations are discussed in more detail below.  CANCER HISTORY:  Oncology History   No history exists.    FAMILY HISTORY:  We obtained a detailed, 4-generation family history.  Significant diagnoses are listed below: Family History  Problem Relation Age of Onset   Breast cancer Mother 53       recurrence 25, then age 59+ had metastatic bone, lung, brain   Osteoarthritis Mother    Osteoporosis Mother    Anemia Mother    Hypertension Mother    Heart disease Mother        stent   Prostate cancer Father        dx 39s   Mood Disorder Father    Dementia Father    Transient ischemic attack Father    Lung cancer Sister    Hernia Brother    Melanoma Brother    Colon cancer Maternal Grandfather 66       approximate age   Skin cancer Maternal Grandfather        multiple, nose, face, head - unsure type   Seizures Daughter    Migraines Daughter    Ovarian cancer Other 24   Breast cancer Other     Gabrielle Silva has 2 daughters (60 and 2) and 4 step children. She has 1 brother, 51, recently diagnosed with melanoma. She has 1 sister, 79, recently diagnosed with non small cell lung cancer. She had tumor testing that showed a TP53 mutation, but negative/normal germline genetic testing. Reports available and reviewed during session.   Gabrielle Silva mother had breast cancer at 62, again at 96, and then lung cancer at 57 that was metastatic and she passed at 64. Gabrielle Silva had 1 maternal uncle and he had pancreatic cancer and died in his 29s.  Maternal grandmother died at 40. Grandfather had colon cancer in his 64s and died in his 5s, he also had skin cancer. He had 2 sisters that also had cancer, 1 sister had ovarian and 1 sister had breast cancer.    Gabrielle Silva father had prostate cancer at 56 and died at 46. Gabrielle Silva had 1 paternal aunt, no known information about her. Paternal grandmother died at 32 of dementia, grandfather died of heart issues at 20.    Gabrielle Silva is aware of previous family history of genetic testing for hereditary cancer risks. Gabrielle Silva's maternal ancestors are of English/Scottish/Welsh descent, and paternal ancestors are of English/Scottish/Welsh descent. There is no reported Ashkenazi Jewish ancestry. There is no known consanguinity.       GENETIC TEST RESULTS: Genetic testing reported out on 11/04/2021 through the [facility] Multi-Cancer+RNA cancer panel found no pathogenic mutations.   The Multi-Cancer Panel + RNA offered by [facility] includes sequencing and/or deletion duplication testing of the following 84 genes: AIP, ALK, APC, ATM, AXIN2,BAP1,  BARD1, BLM, BMPR1A, BRCA1, BRCA2, BRIP1, CASR, CDC73, CDH1, CDK4, CDKN1B, CDKN1C, CDKN2A (p14ARF), CDKN2A (p16INK4a), CEBPA, CHEK2, CTNNA1, DICER1, DIS3L2, EGFR (c.2369C>T, p.Thr790Met variant only), EPCAM (Deletion/duplication testing only), FH, FLCN, GATA2, GPC3, GREM1 (Promoter region deletion/duplication testing only), HOXB13 (c.251G>A, p.Gly84Glu), HRAS, KIT, MAX, MEN1, MET, MITF (c.952G>A,  p.Glu318Lys variant only), MLH1, MSH2, MSH3, MSH6, MUTYH, NBN, NF1, NF2, NTHL1, PALB2, PDGFRA, PHOX2B, PMS2, POLD1, POLE, POT1, PRKAR1A, PTCH1, PTEN, RAD50, RAD51C, RAD51D, RB1, RECQL4, RET, RUNX1, SDHAF2, SDHA (sequence changes only), SDHB, SDHC, SDHD, SMAD4, SMARCA4, SMARCB1, SMARCE1, STK11, SUFU, TERC, TERT, TMEM127, TP53, TSC1, TSC2, VHL, WRN and WT1.   The test report has been scanned into EPIC and is located under the Molecular Pathology section of the Results Review tab.  A  portion of the result report is included below for reference.      We discussed that because current genetic testing is not perfect, it is possible there may be a gene mutation in one of these genes that current testing cannot detect, but that chance is small.  There could be another gene that has not yet been discovered, or that we have not yet tested, that is responsible for the cancer diagnoses in the family. It is also possible there is a hereditary cause for the cancer in the family that Gabrielle Silva did not inherit and therefore was not identified in her testing.  Therefore, it is important to remain in touch with cancer genetics in the future so that we can continue to offer Gabrielle Silva the most up to date genetic testing.   ADDITIONAL GENETIC TESTING: We discussed with Gabrielle Silva that her genetic testing was fairly extensive.  If there are genes identified to increase cancer risk that can be analyzed in the future, we would be happy to discuss and coordinate this testing at that time.    CANCER SCREENING RECOMMENDATIONS: Gabrielle Silva test result is considered negative (normal).  This means that we have not identified a hereditary cause for her family history of cancer at this time.   While reassuring, this does not definitively rule out a hereditary predisposition to cancer. It is still possible that there could be genetic mutations that are undetectable by current technology. There could be genetic mutations in genes that have not been tested or identified to increase cancer risk.  Therefore, it is recommended she continue to follow the cancer management and screening guidelines provided by her primary healthcare provider.   An individual's cancer risk and medical management are not determined by genetic test results alone. Overall cancer risk assessment incorporates additional factors, including personal medical history, family history, and any available genetic information that may  result in a personalized plan for cancer prevention and surveillance.  Based on Gabrielle Silva's personal and family history of cancer as well as her genetic test results, risk model Harriett Rush was used to estimate her risk of developing breast cancer. This estimates her lifetime risk of developing breast cancer to be approximately 14.6%.  The Gabrielle Silva's lifetime breast cancer risk is a preliminary estimate based on available information using one of several models endorsed by the [employer]  (NCCN). The NCCN recommends consideration of breast MRI screening as an adjunct to mammography for patients at high risk (defined as 20% or greater lifetime risk).  This risk estimate can change over time and may be repeated to reflect new information in her personal or family history in the future.      RECOMMENDATIONS FOR FAMILY MEMBERS:  Relatives in this family might be at some increased risk of developing cancer, over the general population risk, simply due to the family history of cancer.  We recommended female relatives in this family have a yearly mammogram beginning at age 17, or 56 years younger than the earliest onset of  cancer, an annual clinical breast exam, and perform monthly breast self-exams. Female relatives in this family should also have a gynecological exam as recommended by their primary provider.  All family members should be referred for colonoscopy starting at age 62.    It is also possible there is a hereditary cause for the cancer in Gabrielle Silva's family that she did not inherit and therefore was not identified in her.  Based on Gabrielle Silva's family history, we recommended maternal relatives have genetic counseling and testing. Gabrielle Silva will let us know if we can be of any assistance in coordinating genetic counseling and/or testing for these family members.  FOLLOW-UP: Lastly, we discussed with Gabrielle Silva that cancer genetics is a rapidly advancing field  and it is possible that new genetic tests will be appropriate for her and/or her family members in the future. We encouraged her to remain in contact with cancer genetics on an annual basis so we can update her personal and family histories and let her know of advances in cancer genetics that may benefit this family.   Our contact number was provided. Gabrielle Silva questions were answered to her satisfaction, and she knows she is welcome to call us at anytime with additional questions or concerns.   Faith Rogue, MS, [facility] Genetic Counselor [email or web address] Phone: [phone]

## 2021-11-09 NOTE — Telephone Encounter (Signed)
Revealed negative genetic testing.  This normal result is reassuring.  It is unlikely that there is an increased risk of cancer due to a mutation in one of these genes.  However, genetic testing is not perfect, and cannot definitively rule out a hereditary cause.  It will be important for her to keep in contact with genetics to learn if any additional testing may be needed in the future.      

## 2021-12-15 ENCOUNTER — Encounter: Payer: Self-pay | Admitting: Dermatology

## 2021-12-15 ENCOUNTER — Other Ambulatory Visit: Payer: Self-pay

## 2021-12-15 ENCOUNTER — Ambulatory Visit (INDEPENDENT_AMBULATORY_CARE_PROVIDER_SITE_OTHER): Payer: 59 | Admitting: Dermatology

## 2021-12-15 DIAGNOSIS — Z1283 Encounter for screening for malignant neoplasm of skin: Secondary | ICD-10-CM

## 2021-12-15 DIAGNOSIS — D2371 Other benign neoplasm of skin of right lower limb, including hip: Secondary | ICD-10-CM

## 2021-12-15 DIAGNOSIS — L578 Other skin changes due to chronic exposure to nonionizing radiation: Secondary | ICD-10-CM | POA: Diagnosis not present

## 2021-12-15 DIAGNOSIS — Z808 Family history of malignant neoplasm of other organs or systems: Secondary | ICD-10-CM

## 2021-12-15 DIAGNOSIS — L821 Other seborrheic keratosis: Secondary | ICD-10-CM

## 2021-12-15 DIAGNOSIS — L814 Other melanin hyperpigmentation: Secondary | ICD-10-CM

## 2021-12-15 DIAGNOSIS — D229 Melanocytic nevi, unspecified: Secondary | ICD-10-CM

## 2021-12-15 DIAGNOSIS — D1801 Hemangioma of skin and subcutaneous tissue: Secondary | ICD-10-CM

## 2021-12-15 NOTE — Patient Instructions (Addendum)
Melanoma ABCDEs  Melanoma is the most dangerous type of skin cancer, and is the leading cause of death from skin disease.  You are more likely to develop melanoma if you: Have light-colored skin, light-colored eyes, or red or blond hair Spend a lot of time in the sun Tan regularly, either outdoors or in a tanning bed Have had blistering sunburns, especially during childhood Have a close family member who has had a melanoma Have atypical moles or large birthmarks  Early detection of melanoma is key since treatment is typically straightforward and cure rates are extremely high if we catch it early.   The first sign of melanoma is often a change in a mole or a new dark spot.  The ABCDE system is a way of remembering the signs of melanoma.  A for asymmetry:  The two halves do not match. B for border:  The edges of the growth are irregular. C for color:  A mixture of colors are present instead of an even brown color. D for diameter:  Melanomas are usually (but not always) greater than 34mm - the size of a pencil eraser. E for evolution:  The spot keeps changing in size, shape, and color.  Please check your skin once per month between visits. You can use a small mirror in front and a large mirror behind you to keep an eye on the back side or your body.   If you see any new or changing lesions before your next follow-up, please call to schedule a visit.  Please continue daily skin protection including broad spectrum sunscreen SPF 30+ to sun-exposed areas, reapplying every 2 hours as needed when you're outdoors.    Recommend taking Heliocare sun protection supplement daily in sunny weather for additional sun protection. For maximum protection on the sunniest days, you can take up to 2 capsules of regular Heliocare OR take 1 capsule of Heliocare Ultra. For prolonged exposure (such as a full day in the sun), you can repeat your dose of the supplement 4 hours after your first dose. Heliocare can be  purchased at [facility] or at [email or web address].     If You Need Anything After Your Visit  If you have any questions or concerns for your doctor, please call our main line at [phone] and press option 4 to reach your doctor's medical assistant. If no one answers, please leave a voicemail as directed and we will return your call as soon as possible. Messages left after 4 pm will be answered the following business day.   You may also send [place] a message via [place]. We typically respond to MyChart messages within 1-2 business days.  For prescription refills, please ask your pharmacy to contact our office. Our fax number is [phone].  If you have an urgent issue when the clinic is closed that cannot wait until the next business day, you can page your doctor at the number below.    Please note that while we do our best to be available for urgent issues outside of office hours, we are not available 24/7.   If you have an urgent issue and are unable to reach [place], you may choose to seek medical care at your doctor's office, retail clinic, urgent care center, or emergency room.  If you have a medical emergency, please immediately call 911 or go to the emergency department.  Pager Numbers  - Dr. Nehemiah Massed: [phone]  - Dr. Laurence Ferrari: [phone]  - Dr. Nicole Kindred: [phone]  In the  event of inclement weather, please call our main line at [phone] for an update on the status of any delays or closures.  Dermatology Medication Tips: Please keep the boxes that topical medications come in in order to help keep track of the instructions about where and how to use these. Pharmacies typically print the medication instructions only on the boxes and not directly on the medication tubes.   If your medication is too expensive, please contact our office at [phone] option 4 or send [place] a message through [place].   We are unable to tell what your co-pay for medications will be in  advance as this is different depending on your insurance coverage. However, we may be able to find a substitute medication at lower cost or fill out paperwork to get insurance to cover a needed medication.   If a prior authorization is required to get your medication covered by your insurance company, please allow [place] 1-2 business days to complete this process.  Drug prices often vary depending on where the prescription is filled and some pharmacies may offer cheaper prices.  The website www.goodrx.com contains coupons for medications through different pharmacies. The prices here do not account for what the cost may be with help from insurance (it may be cheaper with your insurance), but the website can give you the price if you did not use any insurance.  - You can print the associated coupon and take it with your prescription to the pharmacy.  - You may also stop by our office during regular business hours and pick up a GoodRx coupon card.  - If you need your prescription sent electronically to a different pharmacy, notify our office through [facility] or by phone at [phone] option 4.     Si Usted Necesita Algo Despus de Su Visita  Tambin puede enviarnos un mensaje a travs de Pharmacist, community. Por lo general respondemos a los mensajes de MyChart en el transcurso de 1 a 2 das hbiles.  Para renovar recetas, por favor pida a su farmacia que se ponga en contacto con nuestra oficina. Harland Dingwall de fax es [place] [phone].  Si tiene un asunto urgente cuando la clnica est cerrada y que no puede esperar hasta el siguiente da hbil, puede llamar/localizar a su doctor(a) al nmero que aparece a continuacin.   Por favor, tenga en cuenta que aunque hacemos todo lo posible para estar disponibles para asuntos urgentes fuera del horario de [place], no estamos disponibles las 24 horas del da, los 7 das de la [place].   Si tiene un problema urgente y no puede comunicarse con nosotros, puede  optar por buscar atencin mdica  en el consultorio de su doctor(a), en una clnica privada, en un centro de atencin urgente o en una sala de emergencias.  Si tiene Engineering geologist, por favor llame inmediatamente al 911 o vaya a la sala de emergencias.  Nmeros de bper  - Dr. Nehemiah Massed: [phone]  - Dra. Moye: [phone]  - Dra. Nicole Kindred: [phone]  En caso de inclemencias del [place], por favor llame a Johnsie Kindred principal al [phone] para una actualizacin sobre el [place] de cualquier retraso o cierre.  Consejos para la medicacin en dermatologa: Por favor, guarde las cajas en las que vienen los medicamentos de uso tpico para ayudarle a seguir las instrucciones sobre dnde y cmo usarlos. Las farmacias generalmente imprimen las instrucciones del medicamento slo en las cajas y no directamente en los tubos del [place].   Si su  medicamento es [place] caro, por favor, pngase en contacto con Zigmund Daniel llamando al [phone] y presione la opcin 4 o envenos un mensaje a travs de Pharmacist, community.   No podemos decirle cul ser su copago por los medicamentos por adelantado ya que esto es diferente dependiendo de la cobertura de su seguro. Sin embargo, es posible que podamos encontrar un medicamento sustituto a Electrical engineer un formulario para que el seguro cubra el medicamento que se considera necesario.   Si se requiere una autorizacin previa para que su compaa de seguros [place] su medicamento, por favor permtanos de 1 a 2 das hbiles para completar este proceso.  Los precios de los medicamentos varan con frecuencia dependiendo del Environmental consultant de dnde se surte la receta y alguna farmacias pueden ofrecer precios ms baratos.  El sitio web www.goodrx.com tiene cupones para medicamentos de Airline pilot. Los precios aqu no tienen en cuenta lo que podra costar con la ayuda del seguro (puede ser ms barato con su seguro), pero el sitio web puede darle el  precio si no utiliz Research scientist (physical sciences).  - Puede imprimir el cupn correspondiente y llevarlo con su receta a la farmacia.  - Tambin puede pasar por nuestra oficina durante el horario de atencin regular y Charity fundraiser una tarjeta de cupones de GoodRx.  - Si necesita que su receta se enve electrnicamente a una farmacia diferente, informe a nuestra oficina a travs de MyChart de [place] o por telfono llamando al [phone] y presione la opcin 4.

## 2021-12-15 NOTE — Progress Notes (Signed)
° °  Follow-Up Visit   Subjective  Gabrielle Silva is a 61 y.o. female who presents for the following: FBSE (Patient here for full body skin exam and skin cancer screening. Patient with no personal hx of skin cancer, there is a fhx of skin cancer. Patient not aware of any new or changing spots. ).  Patient's brother with hx of recent melanoma.   The following portions of the chart were reviewed this encounter and updated as appropriate:   Tobacco   Allergies   Meds   Problems   Med Hx   Surg Hx   Fam Hx      Review of Systems:  No other skin or systemic complaints except as noted in HPI or Assessment and Plan.  Objective  Well appearing patient in no apparent distress; mood and affect are within normal limits.  A full examination was performed including scalp, head, eyes, ears, nose, lips, neck, chest, axillae, abdomen, back, buttocks, bilateral upper extremities, bilateral lower extremities, hands, feet, fingers, toes, fingernails, and toenails. All findings within normal limits unless otherwise noted below.   Assessment & Plan   Lentigines - Scattered tan macules - Due to sun exposure - Benign-appearing, observe - Recommend daily broad spectrum sunscreen SPF 30+ to sun-exposed areas, reapply every 2 hours as needed. - Call for any changes  Seborrheic Keratoses - Stuck-on, waxy, tan-brown papules and/or plaques  - Benign-appearing - Discussed benign etiology and prognosis. - Observe - Call for any changes  Melanocytic Nevi - Tan-brown and/or pink-flesh-colored symmetric macules and papules - Benign appearing on exam today - Observation - Call clinic for new or changing moles - Recommend daily use of broad spectrum spf 30+ sunscreen to sun-exposed areas.   Hemangiomas - Red papules - Discussed benign nature - Observe - Call for any changes  Actinic Damage - Chronic condition, secondary to cumulative UV/sun exposure - diffuse scaly erythematous macules with underlying  dyspigmentation - Recommend daily broad spectrum sunscreen SPF 30+ to sun-exposed areas, reapply every 2 hours as needed.  - Staying in the shade or wearing long sleeves, sun glasses (UVA+UVB protection) and wide brim hats (4-inch brim around the entire circumference of the hat) are also recommended for sun protection.  - Call for new or changing lesions.  Dermatofibroma - Firm pink/brown papulenodule with dimple sign right lateral knee - Benign appearing - Call for any changes  Family History of Melanoma in Brother - reviewed increased risk in family members - recommend monthly self skin examinations and sun protection as above. Skin cancer screening performed today.  Return in about 1 year (around 12/15/2022) for TBSE.  Graciella Belton, RMA, am acting as scribe for Forest Gleason, MD .  Documentation: I have reviewed the above documentation for accuracy and completeness, and I agree with the above.  Forest Gleason, MD

## 2021-12-22 ENCOUNTER — Encounter: Payer: 59 | Admitting: Dermatology

## 2022-08-05 ENCOUNTER — Telehealth: Payer: Self-pay

## 2022-08-05 ENCOUNTER — Other Ambulatory Visit: Payer: Self-pay | Admitting: Family Medicine

## 2022-08-05 DIAGNOSIS — E7800 Pure hypercholesterolemia, unspecified: Secondary | ICD-10-CM

## 2022-08-05 DIAGNOSIS — Z1231 Encounter for screening mammogram for malignant neoplasm of breast: Secondary | ICD-10-CM

## 2022-08-05 DIAGNOSIS — Z0000 Encounter for general adult medical examination without abnormal findings: Secondary | ICD-10-CM

## 2022-08-05 DIAGNOSIS — R7309 Other abnormal glucose: Secondary | ICD-10-CM

## 2022-08-05 NOTE — Telephone Encounter (Unsigned)
Copied from [place] [id number]. Topic: General - Other >> Aug 05, 2022  3:11 PM Gabrielle Silva wrote: Reason for CRM: The patient has called to request that the lab orders for their upcoming physical on 08/30/22 be submitted to [facility] in [place], [place]   Please contact the patient further if needed

## 2022-08-10 NOTE — Telephone Encounter (Signed)
Please notify patient that all lab orders have been placed.  She can get fasting labs down 1-2 weeks before her apt.  I have ordered them through [place].  However, there is no option for me to select which [place] office she will go to.  The only way I know to do that is to actually fax the lab requisition forms to a particular [facility] office, but if she is not going for a few weeks then it is not a good idea to fax today.  Typically any [facility] site can use the orders I entered from their system.  If she prefers, she may come by to pick up a printed copy of the order requisitions.  Nobie Putnam, [facility] 08/10/2022, 12:05 PM

## 2022-08-15 NOTE — Telephone Encounter (Signed)
Pt advised.  She is going to try and get her labs drawn tomorrow.    Thanks,   -Mickel Baas

## 2022-08-17 LAB — LIPID PANEL
Chol/HDL Ratio: 2.1 ratio (ref 0.0–4.4)
Cholesterol, Total: 222 mg/dL — ABNORMAL HIGH (ref 100–199)
HDL: 106 mg/dL (ref >39)
LDL Chol Calc (NIH): 99 mg/dL (ref 0–99)
Triglycerides: 100 mg/dL (ref 0–149)
VLDL Cholesterol Cal: 17 mg/dL (ref 5–40)

## 2022-08-17 LAB — CBC WITH DIFFERENTIAL/PLATELET
Basophils Absolute: 0.1 10*3/uL (ref 0.0–0.2)
Basos: 1 %
EOS (ABSOLUTE): 0.1 10*3/uL (ref 0.0–0.4)
Eos: 2 %
Hematocrit: 38.5 % (ref 34.0–46.6)
Hemoglobin: 13.2 g/dL (ref 11.1–15.9)
Immature Grans (Abs): 0 10*3/uL (ref 0.0–0.1)
Immature Granulocytes: 0 %
Lymphocytes Absolute: 2.6 10*3/uL (ref 0.7–3.1)
Lymphs: 46 %
MCH: 32.6 pg (ref 26.6–33.0)
MCHC: 34.3 g/dL (ref 31.5–35.7)
MCV: 95 fL (ref 79–97)
Monocytes Absolute: 0.7 10*3/uL (ref 0.1–0.9)
Monocytes: 13 %
Neutrophils Absolute: 2.2 10*3/uL (ref 1.4–7.0)
Neutrophils: 38 %
Platelets: 313 10*3/uL (ref 150–450)
RBC: 4.05 x10E6/uL (ref 3.77–5.28)
RDW: 12.1 % (ref 11.7–15.4)
WBC: 5.7 10*3/uL (ref 3.4–10.8)

## 2022-08-17 LAB — COMPREHENSIVE METABOLIC PANEL
ALT: 23 IU/L (ref 0–32)
AST: 24 IU/L (ref 0–40)
Albumin/Globulin Ratio: 2.1 (ref 1.2–2.2)
Albumin: 4.9 g/dL (ref 3.8–4.9)
Alkaline Phosphatase: 101 IU/L (ref 44–121)
BUN/Creatinine Ratio: 15 (ref 12–28)
BUN: 12 mg/dL (ref 8–27)
Bilirubin Total: 0.4 mg/dL (ref 0.0–1.2)
CO2: 22 mmol/L (ref 20–29)
Calcium: 9.9 mg/dL (ref 8.7–10.3)
Chloride: 99 mmol/L (ref 96–106)
Creatinine, Ser: 0.8 mg/dL (ref 0.57–1.00)
Globulin, Total: 2.3 g/dL (ref 1.5–4.5)
Glucose: 97 mg/dL (ref 70–99)
Potassium: 5 mmol/L (ref 3.5–5.2)
Sodium: 136 mmol/L (ref 134–144)
Total Protein: 7.2 g/dL (ref 6.0–8.5)
eGFR: 84 mL/min/{1.73_m2} (ref >59)

## 2022-08-17 LAB — HEMOGLOBIN A1C
Est. average glucose Bld gHb Est-mCnc: 111 mg/dL
Hgb A1c MFr Bld: 5.5 % (ref 4.8–5.6)

## 2022-08-17 LAB — TSH: TSH: 3.08 u[IU]/mL (ref 0.450–4.500)

## 2022-08-30 ENCOUNTER — Encounter: Payer: Self-pay | Admitting: Family Medicine

## 2022-08-30 ENCOUNTER — Ambulatory Visit (INDEPENDENT_AMBULATORY_CARE_PROVIDER_SITE_OTHER): Payer: 59 | Admitting: Family Medicine

## 2022-08-30 VITALS — BP 131/64 | HR 65 | Ht 65.0 in | Wt 129.2 lb

## 2022-08-30 DIAGNOSIS — Z0000 Encounter for general adult medical examination without abnormal findings: Secondary | ICD-10-CM

## 2022-08-30 DIAGNOSIS — E7800 Pure hypercholesterolemia, unspecified: Secondary | ICD-10-CM

## 2022-08-30 DIAGNOSIS — Z8742 Personal history of other diseases of the female genital tract: Secondary | ICD-10-CM | POA: Diagnosis not present

## 2022-08-30 DIAGNOSIS — Z124 Encounter for screening for malignant neoplasm of cervix: Secondary | ICD-10-CM

## 2022-08-30 NOTE — Progress Notes (Unsigned)
Subjective:    Patient ID: Gabrielle Silva, female    DOB: January 31, 1961, 61 y.o.   MRN: [id number]  Gabrielle Silva is a 61 y.o. female presenting on 08/30/2022 for Annual Exam   HPI  Annual Physical and Lab Review  Physical discomfort R Hip and Left Foot Admits physical discomfort and pain. 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 Last lipid panel 08/2022 showed stable lipids with HDL >100 and LDL down to 99 - Not on statin Lifestyle - Diet: balanced healthy diet - Exercise: regular walking, outdoor activity   Post-menopausal Previously was on OCP, came off in 2014, she was having worsening symptoms of menopausal. She had variety of symptoms. Started on HRT, did well for a while. Now off for past 3 years, doing well. 1 miscarriage / 2 normal pregnancies   History of Gout (Big toes) / Osteoarthritis - multiple joints, thumbs, neck, spine.   History of Depression / Anxiety - Reports that she is currently doing well. Not on medicines. Reviewed past history - No longer on any substances.   Additional question   Updates Due for Shingrix vaccine today   Family history - Adenocarcinoma of the Lung, younger sister age 56, 79timately same cancer that her mother passed from.   Left eye vitreol humor detached, resolved. Not a tear.    Skin Cancer Screening   Health Maintenance: Upcoming Mammogram scheduled 09/01/22  UTD Shingrix vaccine, completed 2022  Future consider Flu vaccine + COVID Booster  Next colonoscopy due 2025      08/30/2022    2:49 PM 08/23/2021    9:08 AM 08/18/2020    9:25 AM  Depression screen PHQ 2/9  Decreased Interest 0 0 0  Down, Depressed, Hopeless 0 0 0  PHQ - 2  Score 0 0 0  Altered sleeping  0   Tired, decreased energy  0   Change in appetite  0   Feeling bad or failure about yourself   0   Trouble concentrating  1   Moving slowly or fidgety/restless  0   Suicidal thoughts  0   PHQ-9 Score  1   Difficult doing work/chores  Not difficult at all     Past Medical History:  Diagnosis Date   Abnormal Pap smear of cervix    Alcoholism (HCC)[place] Anxiety    Depression    Family history of breast cancer    Family history of colon cancer    Family history of lung cancer    Family history of melanoma 09/02/2021   patient's brother, 61, 69s recently diagnosed with a 3.7mm 110mth melanoma on the back of his scalp, stage 2B or 3   Family history of melanoma    Family history of ovarian cancer    Family history of pancreatic cancer    Hyperlipidemia    Past Surgical History:  Procedure Laterality Date   BREAST BIOPSY Left 02/2018   Left side - microcalcifications neg   NOSE SURGERY  2011   deviated septum correct, sinus cavity opening   skin biopsy  2015   negative biopsy   SKIN BIOPSY  2019   negative biopsy   Social History  Socioeconomic History   Marital status: Married    Spouse name: Zafiro Routson   Number of children: Not on file   Years of education: College   Highest education level: Bachelor's degree (e.g., BA, AB, BS)  Occupational History   Not on file  Tobacco Use   Smoking status: Never   Smokeless tobacco: Never  Substance and Sexual Activity   Alcohol use: Not Currently   Drug use: Not Currently    Types: Marijuana   Sexual activity: Not on file  Other Topics Concern   Not on file  Social History Narrative   Not on file   Social Determinants of Health   Financial Resource Strain: Not on file  Food Insecurity: Not on file  Transportation Needs: Not on file  Physical Activity: Not on file  Stress: Not on file  Social Connections: Not on file  Intimate Partner Violence: Not on file   Family History   Problem Relation Age of Onset   Breast cancer Mother 29       recurrence 47, then age 3+ had metastatic bone, lung, brain   Osteoarthritis Mother    Osteoporosis Mother    Anemia Mother    Hypertension Mother    Heart disease Mother        stent   Prostate cancer Father        dx 53s   Mood Disorder Father    Dementia Father    Transient ischemic attack Father    Lung cancer Sister    Hernia Brother    Melanoma Brother    Colon cancer Maternal Grandfather 58       approximate age   Skin cancer Maternal Grandfather        multiple, nose, face, head - unsure type   Seizures Daughter    Migraines Daughter    Ovarian cancer Other 55   Breast cancer Other    Current Outpatient Medications on File Prior to Visit  Medication Sig   ACETAMINOPHEN 8 HOUR PO Take by mouth.   calcium carbonate (TUMS - DOSED IN MG ELEMENTAL CALCIUM) 500 MG chewable tablet Chew 1 tablet by mouth daily.   fluticasone (FLONASE) 50 MCG/ACT nasal spray Place into both nostrils daily.   Glucosamine-Chondroit-Vit C-Mn (GLUCOSAMINE 1500 COMPLEX PO) Take by mouth.   Magnesium Gluconate (MAGNESIUM 27 PO) Take by mouth.   Multiple Vitamin (MULTIVITAMIN) capsule Take 1 capsule by mouth daily.   No current facility-administered medications on file prior to visit.    Review of Systems  Constitutional:  Negative for activity change, appetite change, chills, diaphoresis, fatigue and fever.  HENT:  Negative for congestion and hearing loss.   Eyes:  Negative for visual disturbance.  Respiratory:  Negative for cough, chest tightness, shortness of breath and wheezing.   Cardiovascular:  Negative for chest pain, palpitations and leg swelling.  Gastrointestinal:  Negative for abdominal pain, constipation, diarrhea, nausea and vomiting.  Genitourinary:  Negative for dysuria, frequency and hematuria.  Musculoskeletal:  Negative for arthralgias and neck pain.  Skin:  Negative for rash.  Neurological:  Negative for  dizziness, weakness, light-headedness, numbness and headaches.  Hematological:  Negative for adenopathy.  Psychiatric/Behavioral:  Negative for behavioral problems, dysphoric mood and sleep disturbance.    Per HPI unless specifically indicated above      Objective:    BP 131/64   Pulse 65   Ht _0  (1.651 m)   Wt 129 lb 3.2 oz (58.6 kg)   SpO2 99%  BMI 21.50 kg/m   Wt Readings from Last 3 Encounters:  08/30/22 129 lb 3.2 oz (58.6 kg)  08/23/21 122 lb 3.2 oz (55.4 kg)  06/13/21 117 lb (53.1 kg)    Physical Exam Vitals and nursing note reviewed.  Constitutional:      General: She is not in acute distress.    Appearance: She is well-developed. She is not diaphoretic.     Comments: Well-appearing, comfortable, cooperative  HENT:     Head: Normocephalic and atraumatic.  Eyes:     General:        Right eye: No discharge.        Left eye: No discharge.     Conjunctiva/sclera: Conjunctivae normal.     Pupils: Pupils are equal, round, and reactive to light.  Neck:     Thyroid: No thyromegaly.     Vascular: No carotid bruit.  Cardiovascular:     Rate and Rhythm: Normal rate and regular rhythm.     Pulses: Normal pulses.     Heart sounds: Normal heart sounds. No murmur heard. Pulmonary:     Effort: Pulmonary effort is normal. No respiratory distress.     Breath sounds: Normal breath sounds. No wheezing or rales.  Abdominal:     General: Bowel sounds are normal. There is no distension.     Palpations: Abdomen is soft. There is no mass.     Tenderness: There is no abdominal tenderness.  Musculoskeletal:        General: No tenderness. Normal range of motion.     Cervical back: Normal range of motion and neck supple.     Right lower leg: No edema.     Left lower leg: No edema.     Comments: Upper / Lower Extremities: - Normal muscle tone, strength bilateral upper extremities 5/5, lower extremities 5/5  Lymphadenopathy:     Cervical: No cervical adenopathy.  Skin:     General: Skin is warm and dry.     Findings: No erythema or rash.  Neurological:     Mental Status: She is alert and oriented to person, place, and time.     Comments: Distal sensation intact to light touch all extremities  Psychiatric:        Mood and Affect: Mood normal.        Behavior: Behavior normal.        Thought Content: Thought content normal.     Comments: Well groomed, good eye contact, normal speech and thoughts     Results for orders placed or performed in visit on 08/05/22  CBC with Differential/Platelet  Result Value Ref Range   WBC 5.7 3.4 - 10.8 x10E3/uL   RBC 4.05 3.77 - 5.28 x10E6/uL   Hemoglobin 13.2 11.1 - 15.9 g/dL   Hematocrit 38.5 34.0 - 46.6 %   MCV 95 79 - 97 fL   MCH 32.6 26.6 - 33.0 pg   MCHC 34.3 31.5 - 35.7 g/dL   RDW 12.1 11.7 - 15.4 %   Platelets 313 150 - 450 x10E3/uL   Neutrophils 38 Not Estab. %   Lymphs 46 Not Estab. %   Monocytes 13 Not Estab. %   Eos 2 Not Estab. %   Basos 1 Not Estab. %   Neutrophils Absolute 2.2 1.4 - 7.0 x10E3/uL   Lymphocytes Absolute 2.6 0.7 - 3.1 x10E3/uL   Monocytes Absolute 0.7 0.1 - 0.9 x10E3/uL   EOS (ABSOLUTE) 0.1 0.0 - 0.4 x10E3/uL   Basophils Absolute 0.1 0.0 -  0.2 x10E3/uL   Immature Granulocytes 0 Not Estab. %   Immature Grans (Abs) 0.0 0.0 - 0.1 x10E3/uL  Lipid panel  Result Value Ref Range   Cholesterol, Total 222 (H) 100 - 199 mg/dL   Triglycerides 100 0 - 149 mg/dL   HDL 106 >39 mg/dL   VLDL Cholesterol Cal 17 5 - 40 mg/dL   LDL Chol Calc (NIH) 99 0 - 99 mg/dL   Chol/HDL Ratio 2.1 0.0 - 4.4 ratio  Hemoglobin A1c  Result Value Ref Range   Hgb A1c MFr Bld 5.5 4.8 - 5.6 %   Est. average glucose Bld gHb Est-mCnc 111 mg/dL  TSH  Result Value Ref Range   TSH 3.080 0.450 - 4.500 uIU/mL  Comprehensive metabolic panel  Result Value Ref Range   Glucose 97 70 - 99 mg/dL   BUN 12 8 - 27 mg/dL   Creatinine, Ser 0.80 0.57 - 1.00 mg/dL   eGFR 84 >59 mL/min/1.73   BUN/Creatinine Ratio 15 12 - 28    Sodium 136 134 - 144 mmol/L   Potassium 5.0 3.5 - 5.2 mmol/L   Chloride 99 96 - 106 mmol/L   CO2 22 20 - 29 mmol/L   Calcium 9.9 8.7 - 10.3 mg/dL   Total Protein 7.2 6.0 - 8.5 g/dL   Albumin 4.9 3.8 - 4.9 g/dL   Globulin, Total 2.3 1.5 - 4.5 g/dL   Albumin/Globulin Ratio 2.1 1.2 - 2.2   Bilirubin Total 0.4 0.0 - 1.2 mg/dL   Alkaline Phosphatase 101 44 - 121 IU/L   AST 24 0 - 40 IU/L   ALT 23 0 - 32 IU/L      Assessment & Plan:   Problem List Items Addressed This Visit     Pure hypercholesterolemia   Other Visit Diagnoses     Annual physical exam    -  Primary   History of abnormal cervical Pap smear       Relevant Orders   Ambulatory referral to Obstetrics / Gynecology   Cervical cancer screening       Relevant Orders   Ambulatory referral to Obstetrics / Gynecology       Updated Health Maintenance information Reviewed recent lab results with patient Encouraged improvement to lifestyle with diet and exercise Goal of weight loss  Future - COVID Booster at pharmacy if interested. I do recommend it  Mammogram scheduled  Colonoscopy repeat 2 more years, approx 2025  GYN referral - stay tuned apt, referral sent today. For repeat Pap smear, every 3-5 years.  LIkely osteoarthritis joints hip and other can be causing your symptoms.  START anti inflammatory topical - OTC Voltaren (generic Diclofenac) topical 2-4 times a day as needed for pain swelling of affected joint for 1-2 weeks or longer.  Recommend to start taking Tylenol Extra Strength 542m tabs - take 1 to 2 tabs per dose (max 10069m every 6-8 hours for pain. Prefer to do the one dose at night for Tylenol and then if we need during day can also do 1-2 extra doses 6-8 hours apart. Tylenol is safer than Ibuprofen NSAID and should be the next line of defense, if not working can do the Ibuprofen as needed before hikes, intermittent low dose 20060m 2 = 400m58m reasonable but I worry long term and could worsen acid  reflux etc and kidneys.  We can review foot symptoms more in detail next time, may be neuropathy as you say or could be biomechanical with foot  Orders Placed This Encounter  Procedures   Ambulatory referral to Obstetrics / Gynecology    Referral Priority:   Routine    Referral Type:   Consultation    Referral Reason:   Specialty Services Required    Requested Specialty:   Obstetrics and Gynecology    Number of Visits Requested:   1    No orders of the defined types were placed in this encounter.     Follow up plan: Return in about 3 months (around 11/29/2022) for 3 month follow-up Arthritis Hip / Left Foot Pain ?nerve.  Nobie Putnam, [facility] 08/30/2022, 2:31 PM

## 2022-08-30 NOTE — Patient Instructions (Addendum)
Thank you for coming to the office today.  Future - COVID Booster at pharmacy if interested. I do recommend it  Mammogram scheduled  Colonoscopy repeat 2 more years, approx 2025  GYN referral - stay tuned apt, referral sent today. For repeat Pap smear, every 3-5 years.  LIkely osteoarthritis joints hip and other can be causing your symptoms.  START anti inflammatory topical - OTC Voltaren (generic Diclofenac) topical 2-4 times a day as needed for pain swelling of affected joint for 1-2 weeks or longer.  Recommend to start taking Tylenol Extra Strength '500mg'$  tabs - take 1 to 2 tabs per dose (max '1000mg'$ ) every 6-8 hours for pain. Prefer to do the one dose at night for Tylenol and then if we need during day can also do 1-2 extra doses 6-8 hours apart. Tylenol is safer than Ibuprofen NSAID and should be the next line of defense, if not working can do the Ibuprofen as needed before hikes, intermittent low dose '200mg'$  x 2 = '400mg'$  is reasonable but I worry long term and could worsen acid reflux etc and kidneys.  We can review foot symptoms more in detail next time, may be neuropathy as you say or could be biomechanical with foot   Please schedule a Follow-up Appointment to: Return in about 3 months (around 11/29/2022) for 3 month follow-up Arthritis Hip / Left Foot Pain ?nerve.  If you have any other questions or concerns, please feel free to call the office or send a message through [place]. You may also schedule an earlier appointment if necessary.  Additionally, you may be receiving a survey about your experience at our office within a few days to 1 week by e-mail or mail. We value your feedback.  Nobie Putnam, DO [place]

## 2022-09-01 ENCOUNTER — Ambulatory Visit
Admission: RE | Admit: 2022-09-01 | Discharge: 2022-09-01 | Disposition: A | Discharge status: Home / Self Care | Payer: 59 | Source: Ambulatory Visit | Attending: Family Medicine | Admitting: Family Medicine

## 2022-09-01 DIAGNOSIS — Z1231 Encounter for screening mammogram for malignant neoplasm of breast: Secondary | ICD-10-CM | POA: Diagnosis not present

## 2022-09-23 ENCOUNTER — Encounter: Payer: Self-pay | Admitting: Obstetrics and Gynecology

## 2022-12-01 ENCOUNTER — Ambulatory Visit: Payer: 59 | Admitting: Family Medicine

## 2022-12-21 ENCOUNTER — Ambulatory Visit (INDEPENDENT_AMBULATORY_CARE_PROVIDER_SITE_OTHER): Payer: 59 | Admitting: Dermatology

## 2022-12-21 ENCOUNTER — Encounter: Payer: Self-pay | Admitting: Dermatology

## 2022-12-21 VITALS — BP 132/73 | HR 69

## 2022-12-21 DIAGNOSIS — L821 Other seborrheic keratosis: Secondary | ICD-10-CM

## 2022-12-21 DIAGNOSIS — Z808 Family history of malignant neoplasm of other organs or systems: Secondary | ICD-10-CM

## 2022-12-21 DIAGNOSIS — Z1283 Encounter for screening for malignant neoplasm of skin: Secondary | ICD-10-CM | POA: Diagnosis not present

## 2022-12-21 DIAGNOSIS — L814 Other melanin hyperpigmentation: Secondary | ICD-10-CM | POA: Diagnosis not present

## 2022-12-21 DIAGNOSIS — D2371 Other benign neoplasm of skin of right lower limb, including hip: Secondary | ICD-10-CM

## 2022-12-21 DIAGNOSIS — L578 Other skin changes due to chronic exposure to nonionizing radiation: Secondary | ICD-10-CM | POA: Diagnosis not present

## 2022-12-21 DIAGNOSIS — D229 Melanocytic nevi, unspecified: Secondary | ICD-10-CM

## 2022-12-21 NOTE — Progress Notes (Signed)
   Follow-Up Visit   Subjective  Gabrielle Silva is a 62 y.o. female who presents for the following: Annual Exam (Family hx of MM, family Hx of pancreatic cancer).  The patient presents for Total-Body Skin Exam (TBSE) for skin cancer screening and mole check.  The patient has spots, moles and lesions to be evaluated, some may be new or changing and the patient has concerns that these could be cancer.  The following portions of the chart were reviewed this encounter and updated as appropriate:  Tobacco  Allergies  Meds  Problems  Med Hx  Surg Hx  Fam Hx      Review of Systems: No other skin or systemic complaints except as noted in HPI or Assessment and Plan.   Objective  Well appearing patient in no apparent distress; mood and affect are within normal limits.  A full examination was performed including scalp, head, eyes, ears, nose, lips, neck, chest, axillae, abdomen, back, buttocks, bilateral upper extremities, bilateral lower extremities, hands, feet, fingers, toes, fingernails, and toenails. All findings within normal limits unless otherwise noted below.   Assessment & Plan   Family history of skin cancer - what type(s): MM - who affected: Brother  Lentigines - Scattered tan macules - Due to sun exposure - Benign-appearing, observe - Recommend daily broad spectrum sunscreen SPF 30+ to sun-exposed areas, reapply every 2 hours as needed. - Call for any changes  Seborrheic Keratoses - Stuck-on, waxy, tan-brown papules and/or plaques  - Benign-appearing - Discussed benign etiology and prognosis. - Observe - Call for any changes  Melanocytic Nevi - Tan-brown and/or pink-flesh-colored symmetric macules and papules - Benign appearing on exam today - Observation - Call clinic for new or changing moles - Recommend daily use of broad spectrum spf 30+ sunscreen to sun-exposed areas.   Hemangiomas - Red papules - Discussed benign nature - Observe - Call for any  changes  Actinic Damage - Chronic condition, secondary to cumulative UV/sun exposure - diffuse scaly erythematous macules with underlying dyspigmentation - Recommend daily broad spectrum sunscreen SPF 30+ to sun-exposed areas, reapply every 2 hours as needed.  - Staying in the shade or wearing long sleeves, sun glasses (UVA+UVB protection) and wide brim hats (4-inch brim around the entire circumference of the hat) are also recommended for sun protection.  - Call for new or changing lesions.  Skin cancer screening performed today.  Dermatofibroma. Right calf.  - Firm pink/brown papulenodule with dimple sign - Benign appearing - Call for any changes  Telangiectasia. Left upper lip. - without features suspicious for malignancy on dermoscopy - Dilated blood vessel - Benign appearing on exam - Call for changes   Return in about 1 year (around 12/22/2023) for TBSE, Fm HxMM.  I, Emelia Salisbury, CMA, am acting as scribe for Forest Gleason, MD.  Documentation: I have reviewed the above documentation for accuracy and completeness, and I agree with the above.  Forest Gleason, MD

## 2022-12-21 NOTE — Patient Instructions (Addendum)
Recommend daily broad spectrum sunscreen SPF 30+ to sun-exposed areas, reapply every 2 hours as needed. Call for new or changing lesions.  Staying in the shade or wearing long sleeves, sun glasses (UVA+UVB protection) and wide brim hats (4-inch brim around the entire circumference of the hat) are also recommended for sun protection.   Recommend Vitamin D 600-800 iu daily.  Recommend taking Heliocare sun protection supplement daily in sunny weather for additional sun protection. For maximum protection on the sunniest days, you can take up to 2 capsules of regular Heliocare OR take 1 capsule of Heliocare Ultra. For prolonged exposure (such as a full day in the sun), you can repeat your dose of the supplement 4 hours after your first dose. Heliocare can be purchased at [employer], at some [facility] or at [email or web address].    Melanoma ABCDEs  Melanoma is the most dangerous type of skin cancer, and is the leading cause of death from skin disease.  You are more likely to develop melanoma if you: Have light-colored skin, light-colored eyes, or red or blond hair Spend a lot of time in the sun Tan regularly, either outdoors or in a tanning bed Have had blistering sunburns, especially during childhood Have a close family member who has had a melanoma Have atypical moles or large birthmarks  Early detection of melanoma is key since treatment is typically straightforward and cure rates are extremely high if we catch it early.   The first sign of melanoma is often a change in a mole or a new dark spot.  The ABCDE system is a way of remembering the signs of melanoma.  A for asymmetry:  The two halves do not match. B for border:  The edges of the growth are irregular. C for color:  A mixture of colors are present instead of an even brown color. D for diameter:  Melanomas are usually (but not always) greater than 32m - the size of a pencil eraser. E for evolution:  The spot keeps changing in  size, shape, and color.  Please check your skin once per month between visits. You can use a small mirror in front and a large mirror behind you to keep an eye on the back side or your body.   If you see any new or changing lesions before your next follow-up, please call to schedule a visit.  Please continue daily skin protection including broad spectrum sunscreen SPF 30+ to sun-exposed areas, reapplying every 2 hours as needed when you're outdoors.   Staying in the shade or wearing long sleeves, sun glasses (UVA+UVB protection) and wide brim hats (4-inch brim around the entire circumference of the hat) are also recommended for sun protection.    Due to recent changes in healthcare laws, you may see results of your pathology and/or laboratory studies on MyChart before the doctors have had a chance to review them. We understand that in some cases there may be results that are confusing or concerning to you. Please understand that not all results are received at the same time and often the doctors may need to interpret multiple results in order to provide you with the best plan of care or course of treatment. Therefore, we ask that you please give u[place]2 business days to thoroughly review all your results before contacting the office for clarification. Should we see a critical lab result, you will be contacted sooner.   If You Need Anything After Your Visit  If you have any questions  or concerns for your doctor, please call our main line at [phone] and press option 4 to reach your doctor's medical assistant. If no one answers, please leave a voicemail as directed and we will return your call as soon as possible. Messages left after 4 pm will be answered the following business day.   You may also send [place] a message via [employer]. We typically respond to MyChart messages within 1-2 business days.  For prescription refills, please ask your pharmacy to contact our office. Our fax number is  [phone].  If you have an urgent issue when the clinic is closed that cannot wait until the next business day, you can page your doctor at the number below.    Please note that while we do our best to be available for urgent issues outside of office hours, we are not available 24/7.   If you have an urgent issue and are unable to reach [place], you may choose to seek medical care at your doctor's office, retail clinic, urgent care center, or emergency room.  If you have a medical emergency, please immediately call 911 or go to the emergency department.  Pager Numbers  - Dr. Nehemiah Massed: [phone]  - Dr. Laurence Ferrari: [phone]  - Dr. Nicole Kindred: [phone]  In the event of inclement weather, please call our main line at [phone] for an update on the status of any delays or closures.  Dermatology Medication Tips: Please keep the boxes that topical medications come in in order to help keep track of the instructions about where and how to use these. Pharmacies typically print the medication instructions only on the boxes and not directly on the medication tubes.   If your medication is too expensive, please contact our office at [phone] option 4 or send [place] a message through [place].   We are unable to tell what your co-pay for medications will be in advance as this is different depending on your insurance coverage. However, we may be able to find a substitute medication at lower cost or fill out paperwork to get insurance to cover a needed medication.   If a prior authorization is required to get your medication covered by your insurance company, please allow [place] 1-2 business days to complete this process.  Drug prices often vary depending on where the prescription is filled and some pharmacies may offer cheaper prices.  The website www.goodrx.com contains coupons for medications through different pharmacies. The prices here do not account for what the cost may be with help from  insurance (it may be cheaper with your insurance), but the website can give you the price if you did not use any insurance.  - You can print the associated coupon and take it with your prescription to the pharmacy.  - You may also stop by our office during regular business hours and pick up a GoodRx coupon card.  - If you need your prescription sent electronically to a different pharmacy, notify our office through [facility] or by phone at [phone] option 4.     Si Usted Necesita Algo Despus de Su Visita  Tambin puede enviarnos un mensaje a travs de Pharmacist, community. Por lo general respondemos a los mensajes de MyChart en el transcurso de 1 a 2 das hbiles.  Para renovar recetas, por favor pida a su farmacia que se ponga en contacto con nuestra oficina. Harland Dingwall de fax es [place] [phone].  Si tiene un asunto urgente cuando la clnica est cerrada y que no puede  esperar [place] hbil, puede llamar/localizar a su doctor(a) al nmero que aparece a continuacin.   Por favor, tenga en cuenta que aunque hacemos todo lo posible para estar disponibles para asuntos urgentes fuera del horario de [place], no estamos disponibles las 24 horas del da, los 7 das de la [place].   Si tiene un problema urgente y no puede comunicarse con nosotros, puede optar por buscar atencin mdica  en el consultorio de su doctor(a), en una clnica privada, en un centro de atencin urgente o en una sala de emergencias.  Si tiene Engineering geologist, por favor llame inmediatamente al 911 o vaya a la sala de emergencias.  Nmeros de bper  - Dr. Nehemiah Massed: [phone]  - Dra. Moye: [phone]  - Dra. Nicole Kindred: [phone]  En caso de inclemencias del [place], por favor llame a Johnsie Kindred principal al [phone] para una actualizacin sobre el [place] de cualquier retraso o cierre.  Consejos para la medicacin en dermatologa: Por favor, guarde las cajas en las que vienen los  medicamentos de uso tpico para ayudarle a seguir las instrucciones sobre dnde y cmo usarlos. Las farmacias generalmente imprimen las instrucciones del medicamento slo en las cajas y no directamente en los tubos del [place].   Si su medicamento es muy caro, por favor, pngase en contacto con Zigmund Daniel llamando al [phone] y presione la opcin 4 o envenos un mensaje a travs de Pharmacist, community.   No podemos decirle cul ser su copago por los medicamentos por adelantado ya que esto es diferente dependiendo de la cobertura de su seguro. Sin embargo, es posible que podamos encontrar un medicamento sustituto a Electrical engineer un formulario para que el seguro cubra el medicamento que se considera necesario.   Si se requiere una autorizacin previa para que su compaa de seguros [place] su medicamento, por favor permtanos de 1 a 2 das hbiles para completar este proceso.  Los precios de los medicamentos varan con frecuencia dependiendo del Environmental consultant de dnde se surte la receta y alguna farmacias pueden ofrecer precios ms baratos.  El sitio web www.goodrx.com tiene cupones para medicamentos de Airline pilot. Los precios aqu no tienen en cuenta lo que podra costar con la ayuda del seguro (puede ser ms barato con su seguro), pero el sitio web puede darle el precio si no utiliz Research scientist (physical sciences).  - Puede imprimir el cupn correspondiente y llevarlo con su receta a la farmacia.  - Tambin puede pasar por nuestra oficina durante el horario de atencin regular y Charity fundraiser una tarjeta de cupones de GoodRx.  - Si necesita que su receta se enve electrnicamente a una farmacia diferente, informe a nuestra oficina a travs de MyChart de [place] o por telfono llamando al [phone] y presione la opcin 4.

## 2022-12-22 ENCOUNTER — Encounter: Payer: Self-pay | Admitting: Dermatology

## 2023-01-30 IMAGING — MG MM DIGITAL SCREENING BILAT W/ TOMO AND CAD
8 series · 9 of 24 positions shown · non-contrast
Comparison: Previous exam(s).

CLINICAL DATA: Screening.

EXAM:
DIGITAL SCREENING BILATERAL MAMMOGRAM WITH TOMOSYNTHESIS AND CAD
TECHNIQUE: Bilateral screening digital craniocaudal and mediolateral oblique
mammograms were obtained. Bilateral screening digital breast
tomosynthesis was performed. The images were evaluated with
computer-aided detection.

[L CC synth-2D]
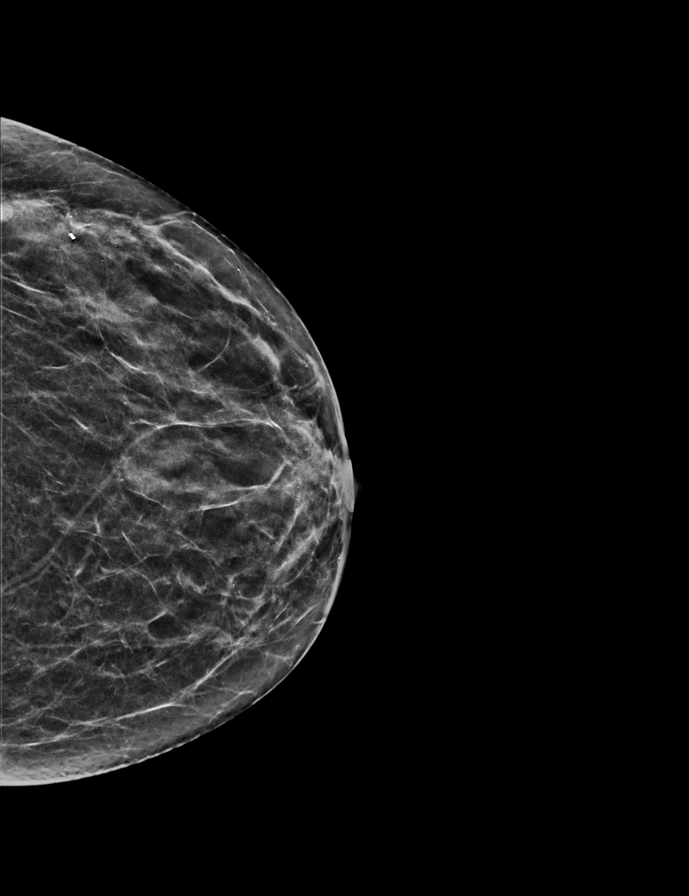

[L MLO synth-2D]
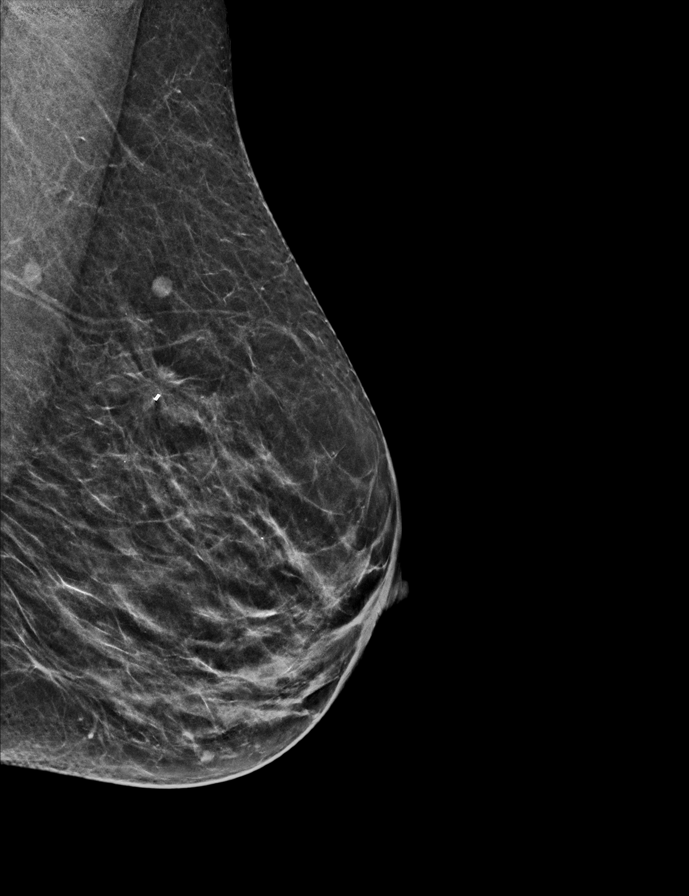

[R CC synth-2D]
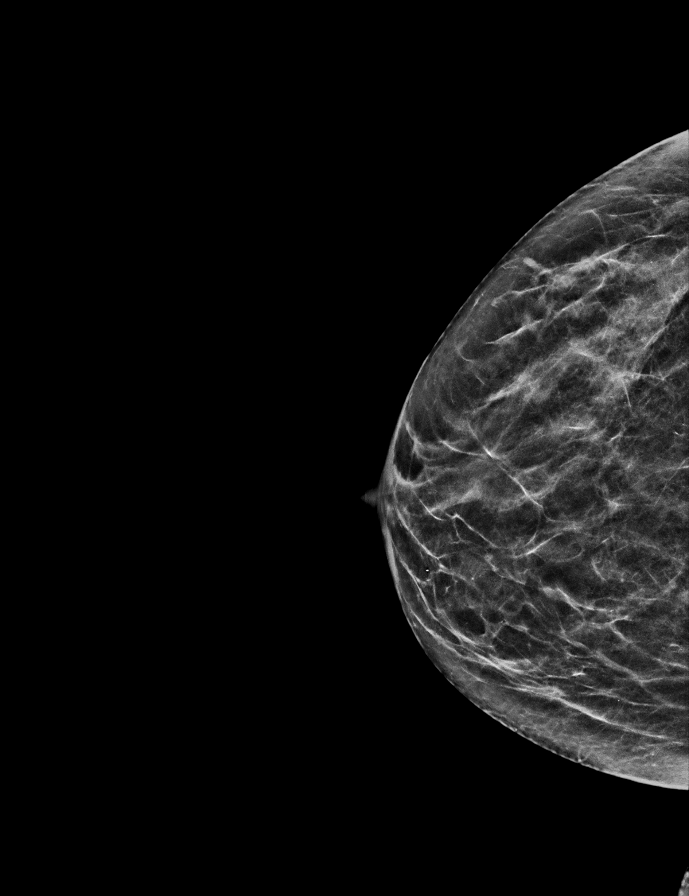

[R MLO synth-2D]
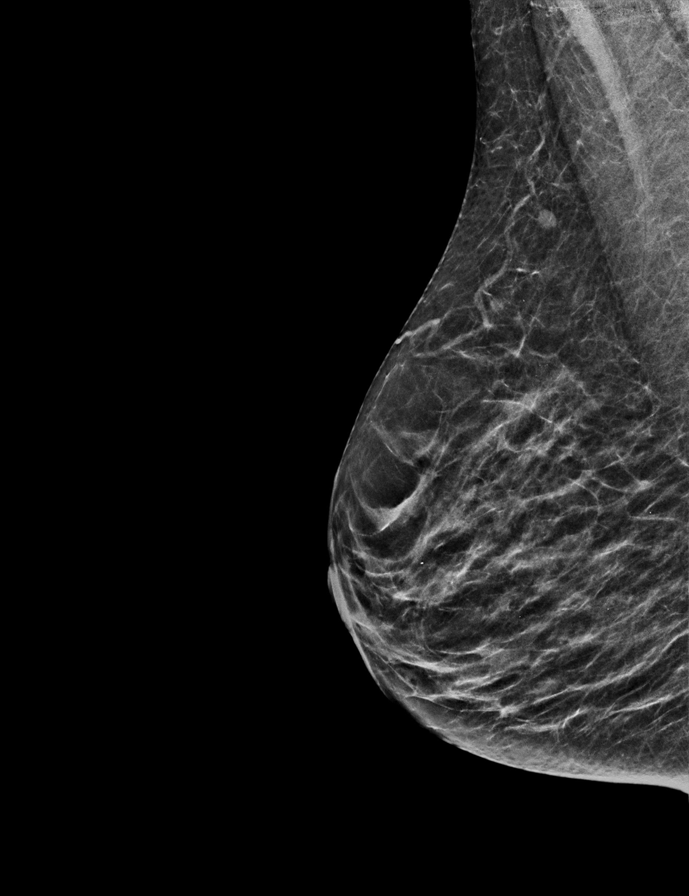

[L MLO tomo · 2 of 45 frames shown]
[frame 15/45]
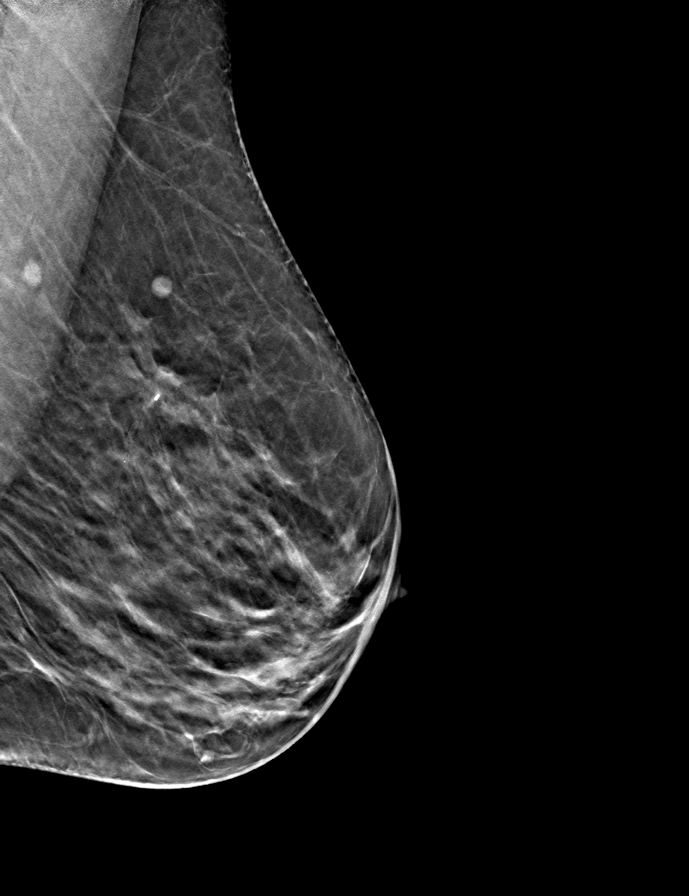
[frame 23/45]
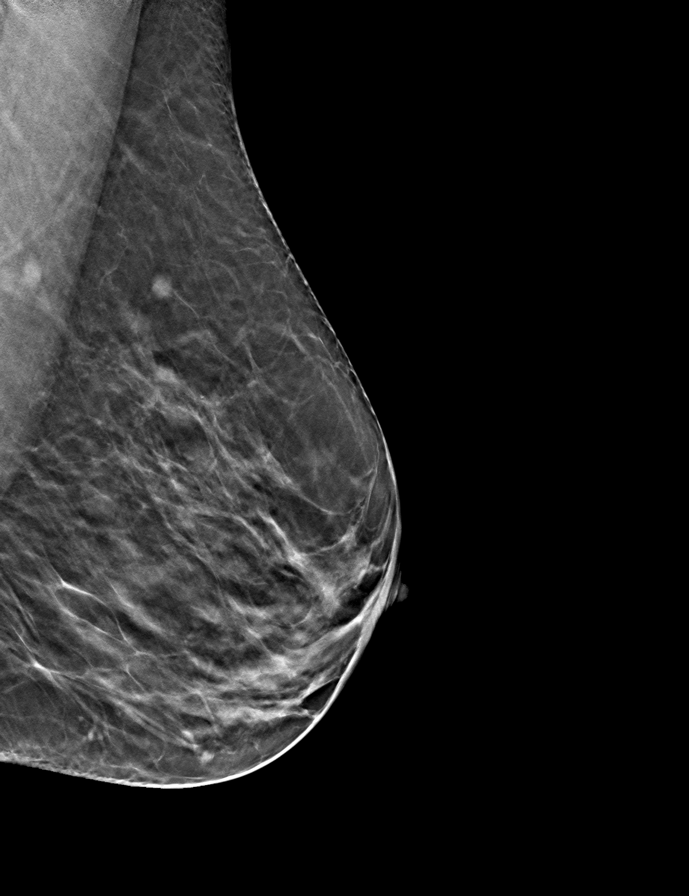

[R MLO tomo · tomo slice 24/47.0]
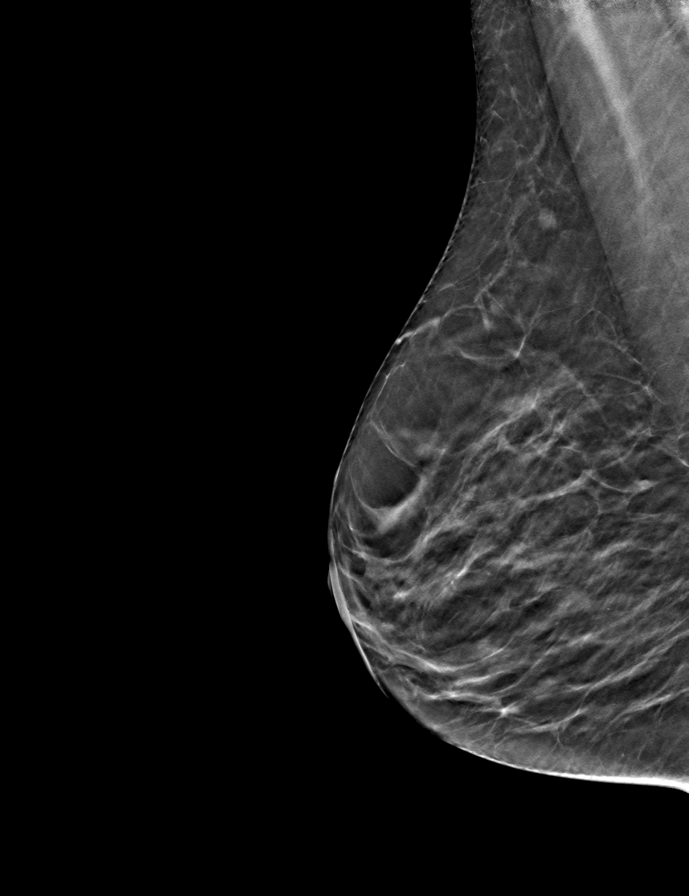

[L CC tomo · tomo slice 24/47.0]
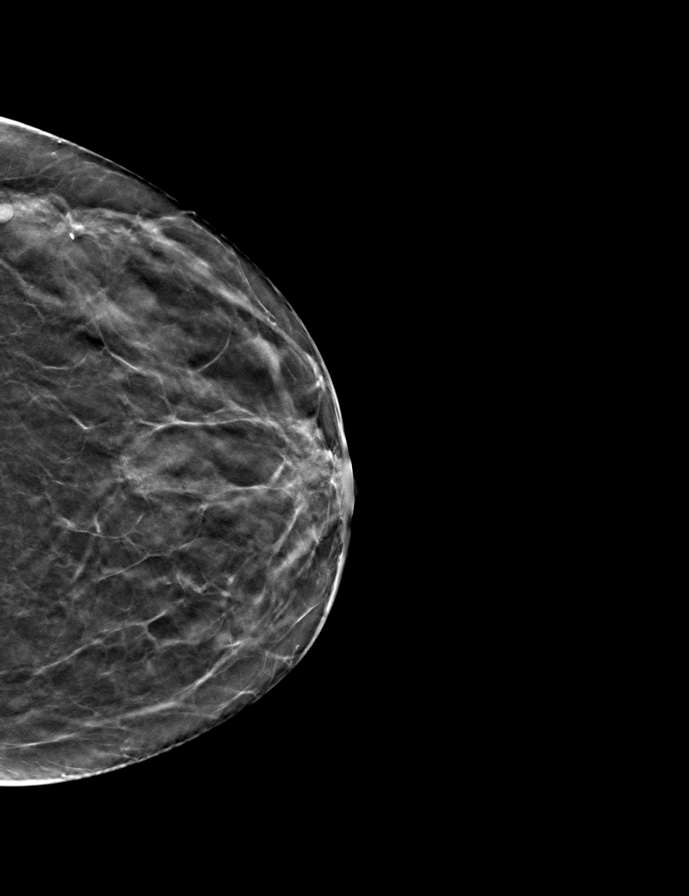

[R CC tomo · tomo slice 25/48.0]
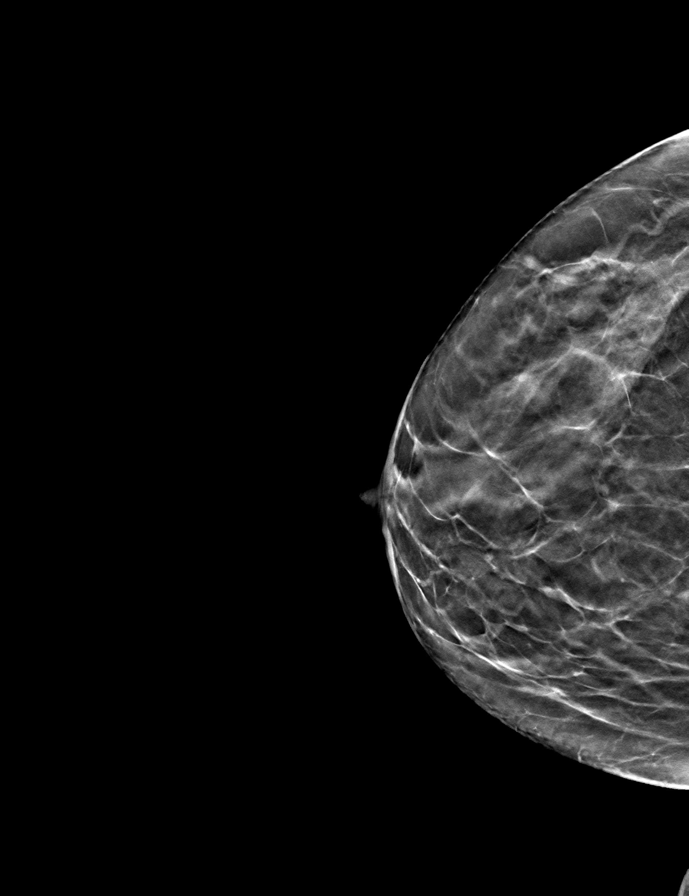

[9 of 24 positions shown; findings below may reference images not displayed]

ACR Breast Density Category c: The breast tissue is heterogeneously
dense, which may obscure small masses.
FINDINGS: There are no findings suspicious for malignancy.
IMPRESSION: No mammographic evidence of malignancy. A result letter of this
screening mammogram will be mailed directly to the patient.

RECOMMENDATION:
Screening mammogram in one year. (Code:Q3-W-BC3)

BI-RADS CATEGORY  1: Negative.

## 2023-08-15 ENCOUNTER — Other Ambulatory Visit: Payer: Self-pay | Admitting: Student

## 2023-08-15 DIAGNOSIS — Z1231 Encounter for screening mammogram for malignant neoplasm of breast: Secondary | ICD-10-CM

## 2023-09-05 ENCOUNTER — Ambulatory Visit
Admission: RE | Admit: 2023-09-05 | Discharge: 2023-09-05 | Disposition: A | Discharge status: Home / Self Care | Payer: 59 | Source: Ambulatory Visit | Attending: Student | Admitting: Student

## 2023-09-05 DIAGNOSIS — Z1231 Encounter for screening mammogram for malignant neoplasm of breast: Secondary | ICD-10-CM | POA: Insufficient documentation

## 2023-12-28 ENCOUNTER — Encounter: Payer: 59 | Admitting: Dermatology

## 2024-06-10 ENCOUNTER — Ambulatory Visit
Admission: EM | Admit: 2024-06-10 | Discharge: 2024-06-10 | Disposition: A | Discharge status: Home / Self Care | Attending: Emergency Medicine | Admitting: Emergency Medicine

## 2024-06-10 ENCOUNTER — Encounter: Payer: Self-pay | Admitting: Emergency Medicine

## 2024-06-10 DIAGNOSIS — N390 Urinary tract infection, site not specified: Secondary | ICD-10-CM | POA: Insufficient documentation

## 2024-06-10 LAB — URINALYSIS, W/ REFLEX TO CULTURE (INFECTION SUSPECTED)
Bilirubin Urine: NEGATIVE
Glucose, UA: NEGATIVE mg/dL
Ketones, ur: NEGATIVE mg/dL
Nitrite: NEGATIVE
Protein, ur: 30 mg/dL — AB
Specific Gravity, Urine: 1.01 (ref 1.005–1.030)
WBC, UA: >50 WBC/hpf (ref 0–5)
pH: 6.5 (ref 5.0–8.0)

## 2024-06-10 MED ORDER — NITROFURANTOIN MONOHYD MACRO 100 MG PO CAPS: 100.0000 mg | ORAL_CAPSULE | Freq: Two times a day (BID) | ORAL | 0 refills | Status: DC

## 2024-06-10 MED ORDER — PHENAZOPYRIDINE HCL 200 MG PO TABS: 200.0000 mg | ORAL_TABLET | Freq: Three times a day (TID) | ORAL | 0 refills | Status: DC

## 2024-06-10 NOTE — ED Provider Notes (Signed)
 [facility]    CSN: [id number] Arrival date & time: 06/10/24  0950      History   Chief Complaint Chief Complaint  Patient presents with   Dysuria   Hematuria    HPI Gabrielle Silva is a 63 y.o. female.   HPI  63 year old female with past medical history significant for anxiety, depression, and hyperlipidemia presents for evaluation of dysuria, urinary frequency, and hematuria that started yesterday afternoon.  She also reports an episode of chills but she did not measure a fever.  She denies abdominal pain, low back pain, nausea, vomiting, vaginal discharge, or vaginal itching.  Past Medical History:  Diagnosis Date   Abnormal Pap smear of cervix    Alcoholism (HCC)    Anxiety    Depression    Family history of breast cancer    Family history of colon cancer    Family history of lung cancer    Family history of melanoma 09/02/2021   patient's brother, 30, was recently diagnosed with a 3.32mm depth melanoma on the back of his scalp, stage 2B or 3   Family history of melanoma    Family history of ovarian cancer    Family history of pancreatic cancer    Hyperlipidemia     Patient Active Problem List   Diagnosis Date Noted   Genetic testing 11/09/2021   Family history of breast cancer 10/05/2021   Family history of melanoma 10/05/2021   Family history of lung cancer 10/05/2021   Family history of colon cancer 10/05/2021   Family history of pancreatic cancer 10/05/2021   Family history of ovarian cancer 10/05/2021   Pure hypercholesterolemia 08/19/2020   Dry skin dermatitis 03/18/2020   Joint stiffness of spine 03/18/2020   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      Home Medications    Prior to Admission medications   Medication Sig Start Date End Date Taking? Authorizing Provider  meloxicam (MOBIC) 7.5 MG tablet Take 7.5 mg by mouth once daily 08/02/23 08/01/24 Yes [provider]  nitrofurantoin , macrocrystal-monohydrate, (MACROBID ) 100 MG capsule Take 1 capsule (100 mg total) by mouth 2 (two) times daily. 06/10/24  Yes Bernardino Ditch, NP  phenazopyridine  (PYRIDIUM ) 200 MG tablet Take 1 tablet (200 mg total) by mouth 3 (three) times daily. 06/10/24  Yes Bernardino Ditch, NP  ACETAMINOPHEN 8 HOUR PO Take by mouth.    [provider]  calcium carbonate (TUMS - DOSED IN MG ELEMENTAL CALCIUM) 500 MG chewable tablet Chew 1 tablet by mouth daily.    [provider]  fluticasone (FLONASE) 50 MCG/ACT nasal spray Place into both nostrils daily.    [provider]  Glucosamine-Chondroit-Vit C-Mn (GLUCOSAMINE 1500 COMPLEX PO) Take by mouth.    [provider]  Magnesium Gluconate (MAGNESIUM 27 PO) Take by mouth.    [provider]  Multiple Vitamin (MULTIVITAMIN) capsule Take 1 capsule by mouth daily.    [provider]    Family History Family History  Problem Relation Age of Onset   Breast cancer Mother 31  recurrence 72, then age 17+ had metastatic bone, lung, brain   Osteoarthritis Mother    Osteoporosis Mother    Anemia Mother    Hypertension Mother    Heart disease Mother        stent   Prostate cancer Father        dx 40s   Mood Disorder Father    Dementia Father    Transient ischemic attack Father    Lung cancer Sister    Hernia Brother    Melanoma Brother    Colon cancer Maternal Grandfather 56       approximate age   Skin cancer Maternal Grandfather        multiple, nose, face, head - unsure type   Seizures Daughter    Migraines Daughter    Ovarian cancer Other 47   Breast cancer Other     Social  History Social History   Tobacco Use   Smoking status: Never   Smokeless tobacco: Never  Substance Use Topics   Alcohol use: Not Currently   Drug use: Not Currently    Types: Marijuana     Allergies   Patient has no known allergies.   Review of Systems Review of Systems  Constitutional:  Positive for chills. Negative for fever.  Gastrointestinal:  Negative for abdominal pain, nausea and vomiting.  Genitourinary:  Positive for dysuria, frequency, hematuria and urgency. Negative for vaginal discharge and vaginal pain.  Musculoskeletal:  Negative for back pain.     Physical Exam Triage Vital Signs ED Triage Vitals  Encounter Vitals Group     BP      Girls Systolic BP Percentile      Girls Diastolic BP Percentile      Boys Systolic BP Percentile      Boys Diastolic BP Percentile      Pulse      Resp      Temp      Temp src      SpO2      Weight      Height      Head Circumference      Peak Flow      Pain Score      Pain Loc      Pain Education      Exclude from Growth Chart    No data found.  Updated Vital Signs BP 112/70 (BP Location: Left Arm)   Pulse 73   Temp 98.4 F (36.9 C) (Oral)   Resp 16   Ht 5' 5 (1.651 m)   Wt 128 lb (58.1 kg)   SpO2 99%   BMI 21.30 kg/m   Visual Acuity Right Eye Distance:   Left Eye Distance:   Bilateral Distance:    Right Eye Near:   Left Eye Near:    Bilateral Near:     Physical Exam Vitals and nursing note reviewed.  Constitutional:      Appearance: Normal appearance. She is not ill-appearing.  HENT:     Head: Normocephalic and atraumatic.  Cardiovascular:     Rate and Rhythm: Normal rate and regular rhythm.     Pulses: Normal pulses.     Heart sounds: Normal heart sounds. No murmur heard.    No friction rub. No gallop.  Pulmonary:     Effort: Pulmonary effort is normal.     Breath sounds: Normal breath sounds. No wheezing, rhonchi or rales.  Abdominal:     Tenderness: There is no right CVA tenderness  or left CVA tenderness.  Skin:    General: Skin is warm and dry.     Capillary Refill: Capillary refill takes less than 2 seconds.     Findings: No rash.  Neurological:     General: No focal deficit present.     Mental Status: She is alert and oriented to person, place, and time.      UC Treatments / Results  Labs (all labs ordered are listed, but only abnormal results are displayed) Labs Reviewed  URINALYSIS, W/ REFLEX TO CULTURE (INFECTION SUSPECTED) - Abnormal; Notable for the following components:      Result Value   APPearance HAZY (*)    Hgb urine dipstick MODERATE (*)    Protein, ur 30 (*)    Leukocytes,Ua LARGE (*)    Bacteria, UA MANY (*)    All other components within normal limits  URINE CULTURE    EKG   Radiology No results found.  Procedures Procedures (including critical care time)  Medications Ordered in UC Medications - No data to display  Initial Impression / Assessment and Plan / UC Course  I have reviewed the triage vital signs and the nursing notes.  Pertinent labs & imaging results that were available during my care of the patient were reviewed by me and considered in my medical decision making (see chart for details).   Patient is a nontoxic-appearing 63 year old female presenting for evaluation of UTI symptoms outlined HPI above.  She reports that she is never had a UTI in the past.  She is not currently sexually active because she is healing from a hip replacement on the right that occurred 1 year ago.  She reports that she had spinal anesthesia at that time and did not have a urinary catheter.  She has not had any recent catheterizations, has not been swimming, and denies being a hot tub.  She also denies vaginal itching or discharge.  I will order a urinalysis to assess for the presence of UTI.  Urinalysis has a hazy appearance with moderate hemoglobin, 30 protein, and large leukocyte esterase.  Negative for nitrites, ketones, or glucose.   Reflex microscopy shows greater than 50 WBCs, 21-50 RBCs, and many bacteria.  Urine will reflex to culture.  I will discharge patient with a diagnosis of UTI and start her on Macrobid  100 mg twice daily with food for 5 days.  Also Pyridium  200 mg every 8 hours as needed for urinary discomfort.  Return precautions reviewed.   Final Clinical Impressions(s) / UC Diagnoses   Final diagnoses:  Lower urinary tract infectious disease     Discharge Instructions      Take the Macrobid  twice daily for 5 days with food for treatment of urinary tract infection.  Use the Pyridium  every 8 hours as needed for urinary discomfort.  This will turn your urine a bright red-orange.  Increase your oral fluid intake so that you increase your urine production and or flushing your urinary system.  Take an over-the-counter probiotic, such as Culturelle-Align-Activia, 1 hour after each dose of antibiotic to prevent diarrhea or yeast infections from forming.  We will culture urine and change the antibiotics if necessary.  Return for reevaluation, or see your primary care provider, for any new or worsening symptoms.      ED Prescriptions     Medication Sig Dispense Auth. Provider   nitrofurantoin , macrocrystal-monohydrate, (MACROBID ) 100 MG capsule Take 1 capsule (100 mg total) by mouth 2 (two) times daily. 10 capsule Bernardino,  Venetia, NP   phenazopyridine  (PYRIDIUM ) 200 MG tablet Take 1 tablet (200 mg total) by mouth 3 (three) times daily. 6 tablet Bernardino Venetia, NP      PDMP not reviewed this encounter.   Bernardino Venetia, NP 06/10/24 1044

## 2024-06-10 NOTE — ED Triage Notes (Signed)
 Pt c/o urinary frequency,burning,hematuria & pain x1 day. No OTC meds.

## 2024-06-10 NOTE — Discharge Instructions (Signed)

## 2024-06-12 LAB — URINE CULTURE: Culture: 40000 — AB

## 2024-06-13 ENCOUNTER — Ambulatory Visit (HOSPITAL_COMMUNITY): Payer: Self-pay

## 2024-08-06 ENCOUNTER — Other Ambulatory Visit: Payer: Self-pay | Admitting: Student

## 2024-08-06 DIAGNOSIS — Z1231 Encounter for screening mammogram for malignant neoplasm of breast: Secondary | ICD-10-CM

## 2024-09-05 ENCOUNTER — Ambulatory Visit
Admission: RE | Admit: 2024-09-05 | Discharge: 2024-09-05 | Disposition: A | Discharge status: Home / Self Care | Source: Ambulatory Visit | Attending: Student | Admitting: Student

## 2024-09-05 DIAGNOSIS — Z1231 Encounter for screening mammogram for malignant neoplasm of breast: Secondary | ICD-10-CM | POA: Insufficient documentation

## 2024-12-28 ENCOUNTER — Encounter: Payer: Self-pay | Admitting: Emergency Medicine

## 2024-12-28 ENCOUNTER — Ambulatory Visit
Admission: EM | Admit: 2024-12-28 | Discharge: 2024-12-28 | Disposition: A | Discharge status: Home / Self Care | Attending: Family Medicine | Admitting: Family Medicine

## 2024-12-28 DIAGNOSIS — R10811 Right upper quadrant abdominal tenderness: Secondary | ICD-10-CM | POA: Diagnosis not present

## 2024-12-28 DIAGNOSIS — R1010 Upper abdominal pain, unspecified: Secondary | ICD-10-CM

## 2024-12-28 NOTE — ED Provider Notes (Signed)
 " [facility]    CSN: [id number] Arrival date & time: 12/28/24  1113      History   Chief Complaint Chief Complaint  Patient presents with   Abdominal Pain    HPI Gabrielle Silva is a 63 y.o. female.   HPI  Gabrielle Silva presents for intermittent sharp upper abdominal pain for the past 10 days.  Pain gets worse after dinner for the past 3  nights.  Last night, she was not able to sleep due to a band across her abdomen that wont go away.  She had similar sx with pancreatitis. Notes that she has history of heavy alcohol use in the past but has not drank any alcohol. Last bowel movement was in the past day.  No LMP recorded. Patient is postmenopausal.    Past Surgeries: no prior abdominal surgeries   Symptoms Nausea/Vomiting: no  Diarrhea: no  Constipation: no  Melena/BRBPR: no  Hematemesis: no  Anorexia: no  Fever/Chills: yes, chils  Dysuria: no  Rash: no  Wt loss: no  Sleep disturbance: yes    Past Medical History:  Diagnosis Date   Abnormal Pap smear of cervix    Alcoholism (HCC)    Anxiety    Depression    Family history of breast cancer    Family history of colon cancer    Family history of lung cancer    Family history of melanoma 09/02/2021   patient's brother, 55, was recently diagnosed with a 3.73mm depth melanoma on the back of his scalp, stage 2B or 3   Family history of melanoma    Family history of ovarian cancer    Family history of pancreatic cancer    Hyperlipidemia     Patient Active Problem List   Diagnosis Date Noted   Genetic testing 11/09/2021   Family history of breast cancer 10/05/2021   Family history of melanoma 10/05/2021   Family history of lung cancer 10/05/2021   Family history of colon cancer 10/05/2021   Family history of pancreatic cancer 10/05/2021   Family history of ovarian cancer 10/05/2021   Pure hypercholesterolemia 08/19/2020   Dry skin dermatitis 03/18/2020   Joint stiffness of spine  03/18/2020   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      Home Medications    Prior to Admission medications  Medication Sig Start Date End Date Taking? Authorizing Provider  tretinoin (RETIN-A) 0.025 % cream Apply topically at bedtime.   Yes [provider]  ACETAMINOPHEN 8 HOUR PO Take by mouth.    [provider]  calcium carbonate (TUMS - DOSED IN MG ELEMENTAL CALCIUM) 500 MG chewable tablet Chew 1 tablet by mouth daily.    [provider]  fluticasone (FLONASE) 50 MCG/ACT nasal spray Place into both nostrils daily.    [provider]  Glucosamine-Chondroit-Vit C-Mn (GLUCOSAMINE 1500 COMPLEX PO) Take by mouth.    [provider]  Magnesium Gluconate (MAGNESIUM 27 PO) Take by mouth.    [provider]  Multiple Vitamin (MULTIVITAMIN) capsule Take 1 capsule by mouth daily.  [provider]    Family History Family History  Problem Relation Age of Onset   Breast cancer Mother 35       recurrence 37, then age 9+ had metastatic bone, lung, brain   Osteoarthritis Mother    Osteoporosis Mother    Anemia Mother    Hypertension Mother    Heart disease Mother        stent   Prostate cancer Father        dx 87s   Mood Disorder Father    Dementia Father    Transient ischemic attack Father    Lung cancer Sister    Hernia Brother    Melanoma Brother    Colon cancer Maternal Grandfather 39       approximate age   Skin cancer Maternal Grandfather        multiple, nose, face, head - unsure type   Seizures Daughter    Migraines Daughter     Ovarian cancer Other 40   Breast cancer Other     Social History Social History[1]   Allergies   Patient has no known allergies.   Review of Systems Review of Systems :negative unless otherwise stated in HPI.      Physical Exam Triage Vital Signs ED Triage Vitals  Encounter Vitals Group     BP 12/28/24 1156 123/77     Girls Systolic BP Percentile --      Girls Diastolic BP Percentile --      Boys Systolic BP Percentile --      Boys Diastolic BP Percentile --      Pulse Rate 12/28/24 1156 76     Resp --      Temp 12/28/24 1156 98.6 F (37 C)     Temp Source 12/28/24 1156 Oral     SpO2 12/28/24 1156 98 %     Weight --      Height --      Head Circumference --      Peak Flow --      Pain Score 12/28/24 1154 0     Pain Loc --      Pain Education --      Exclude from Growth Chart --    No data found.  Updated Vital Signs BP 123/77 (BP Location: Left Arm)   Pulse 76   Temp 98.6 F (37 C) (Oral)   SpO2 98%   Visual Acuity Right Eye Distance:   Left Eye Distance:   Bilateral Distance:    Right Eye Near:   Left Eye Near:    Bilateral Near:     Physical Exam  GEN: non-toxic appearing female, in no acute distress   CV: regular rate and rhythm  RESP: no increased work of breathing, clear to ascultation bilaterally ABD: Bowel sounds present. Soft, epigastric and RUQ tenderness, non-distended.  No guarding, no rebound, no appreciable hepatosplenomegaly, no CVA tenderness, negative McBurney's, equivocal Murphy MSK: no extremity edema SKIN: warm, dry, no rash on visible skin NEURO: alert, moves all extremities appropriately    UC Treatments / Results  Labs (all labs ordered are listed, but only abnormal results are displayed) Labs Reviewed - No data to display  EKG  If EKG performed, see my interpretation and MDM section  Radiology No results found.   Procedures Procedures (including critical care time)  Medications Ordered in UC Medications  - No data to display  Initial Impression / Assessment and Plan / UC Course  I have reviewed  the triage vital signs and the nursing notes.  Pertinent labs & imaging results that were available during my care of the patient were reviewed by me and considered in my medical decision making (see chart for details).       Patient is a  64 y.o. femalewith history *** who presents after having insidious severe abdominal pain about *** ago.  Overall, patient is well-appearing, well-hydrated, and in no acute distress.  Vital signs stable.  Sarahis afebrile.  Exam is ***not concerning for an acute abdomen.  Obtained UA, urine pregnancy***, CBC, CMP, and lipase.  No personal history of kidney stones.    DDX includes but not limited to: UTI, STI, cholecystitis, pancreatitis, gastroenteritis, nephrolithiasis, constipation, appendicitis,ovarian torsion/cyst    Follow-up, return and ED precautions given.  Discussed MDM, treatment plan and plan for follow-up with patient/parent who agrees with plan.    Final Clinical Impressions(s) / UC Diagnoses   Final diagnoses:  None   Discharge Instructions   None    ED Prescriptions   None    PDMP not reviewed this encounter.       [1] Social History Tobacco Use   Smoking status: Never   Smokeless tobacco: Never  Substance Use Topics   Alcohol use: Not Currently   Drug use: Not Currently    Types: Marijuana  "

## 2024-12-28 NOTE — Discharge Instructions (Signed)

## 2024-12-28 NOTE — ED Triage Notes (Signed)
 Pt c/o abd pain just below the ribs sharp pain that occurs after dinner x 10 days. She states on two occasions she has been unable to sleep. Last BM this morning. Denies any urinary sx. Thursday had a low grade fever.

## 2024-12-28 NOTE — ED Notes (Signed)
 Patient is being discharged from the Urgent Care and sent to the [facility] via private vehicle . Per Dr. Kriste, patient is in need of higher level of care due to Abdominal Pain. Patient is aware and verbalizes understanding of plan of care.  Vitals:   12/28/24 1156  BP: 123/77  Pulse: 76  Temp: 98.6 F (37 C)  SpO2: 98%
